# Patient Record
Sex: Female | Born: 1995 | Race: White | Hispanic: No | Marital: Single | Smoking: Never smoker
Health system: Southern US, Community
[De-identification: ages and names within clinical notes are randomized; demographics above are authoritative.]

## PROBLEM LIST (undated history)

## (undated) DIAGNOSIS — L309 Dermatitis, unspecified: Secondary | ICD-10-CM

## (undated) DIAGNOSIS — E039 Hypothyroidism, unspecified: Secondary | ICD-10-CM

## (undated) DIAGNOSIS — Z872 Personal history of diseases of the skin and subcutaneous tissue: Secondary | ICD-10-CM

## (undated) DIAGNOSIS — G43909 Migraine, unspecified, not intractable, without status migrainosus: Secondary | ICD-10-CM

## (undated) DIAGNOSIS — E063 Autoimmune thyroiditis: Secondary | ICD-10-CM

## (undated) DIAGNOSIS — E282 Polycystic ovarian syndrome: Secondary | ICD-10-CM

## (undated) DIAGNOSIS — L438 Other lichen planus: Secondary | ICD-10-CM

## (undated) DIAGNOSIS — K589 Irritable bowel syndrome without diarrhea: Secondary | ICD-10-CM

## (undated) DIAGNOSIS — M359 Systemic involvement of connective tissue, unspecified: Secondary | ICD-10-CM

## (undated) DIAGNOSIS — R7989 Other specified abnormal findings of blood chemistry: Secondary | ICD-10-CM

## (undated) DIAGNOSIS — D8989 Other specified disorders involving the immune mechanism, not elsewhere classified: Secondary | ICD-10-CM

## (undated) DIAGNOSIS — E079 Disorder of thyroid, unspecified: Secondary | ICD-10-CM

## (undated) HISTORY — DX: Dermatitis, unspecified: L30.9

## (undated) HISTORY — DX: Autoimmune thyroiditis: E06.3

## (undated) HISTORY — DX: Systemic involvement of connective tissue, unspecified: M35.9

## (undated) HISTORY — DX: Irritable bowel syndrome, unspecified: K58.9

## (undated) HISTORY — DX: Hypothyroidism, unspecified: E03.9

## (undated) HISTORY — DX: Personal history of diseases of the skin and subcutaneous tissue: Z87.2

## (undated) HISTORY — DX: Other lichen planus: L43.8

## (undated) HISTORY — DX: Migraine, unspecified, not intractable, without status migrainosus: G43.909

## (undated) HISTORY — DX: Polycystic ovarian syndrome: E28.2

## (undated) HISTORY — DX: Other specified abnormal findings of blood chemistry: R79.89

## (undated) HISTORY — DX: Disorder of thyroid, unspecified: E07.9

## (undated) HISTORY — DX: Other specified disorders involving the immune mechanism, not elsewhere classified: D89.89

## (undated) HISTORY — DX: Irritable bowel syndrome without diarrhea: K58.9

---
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
## 2014-02-03 ENCOUNTER — Ambulatory Visit (INDEPENDENT_AMBULATORY_CARE_PROVIDER_SITE_OTHER): Payer: BC Managed Care – PPO | Admitting: Physician Assistant

## 2014-02-03 VITALS — BP 110/70 | HR 84 | Temp 98.2°F | Resp 18 | Ht 64.5 in | Wt 182.0 lb

## 2014-02-03 DIAGNOSIS — Z7185 Encounter for immunization safety counseling: Secondary | ICD-10-CM

## 2014-02-03 DIAGNOSIS — Z7189 Other specified counseling: Secondary | ICD-10-CM

## 2014-02-03 NOTE — Progress Notes (Signed)
   Subjective:    Patient ID: Ariana Scott, female    DOB: 06/01/1996, 18 y.o.   MRN: [id number]  HPI Pt here for an immunization review for college and to have her form filled out.  She does not have to have a CPE for the school form.  Review of Systems     Objective:   Physical Exam  Vitals reviewed. Constitutional: She appears well-developed and well-nourished.  HENT:  Head: Normocephalic and atraumatic.  Right Ear: External ear normal.  Left Ear: External ear normal.  Pulmonary/Chest: Effort normal.  Skin: Skin is warm and dry.  Psychiatric: She has a normal mood and affect. Her behavior is normal. Judgment and thought content normal.       Assessment & Plan:  Immunization counseling Her immunizations were reviewed today.  She needs her 3rd Gardasil.  Her got her Menactra in the 6th grade but she wants to repeat neither of these series today.  She is UTD for vaccines for her college form.   Benny Lennert PA-C  [facility] 02/03/2014 9:26 AM

## 2014-09-12 DIAGNOSIS — R197 Diarrhea, unspecified: Secondary | ICD-10-CM | POA: Insufficient documentation

## 2014-09-12 HISTORY — PX: WISDOM TOOTH EXTRACTION: SHX21

## 2015-09-13 DIAGNOSIS — L438 Other lichen planus: Secondary | ICD-10-CM | POA: Insufficient documentation

## 2018-08-14 DIAGNOSIS — L853 Xerosis cutis: Secondary | ICD-10-CM | POA: Insufficient documentation

## 2018-09-12 HISTORY — PX: UPPER GASTROINTESTINAL ENDOSCOPY: SHX188

## 2018-09-12 HISTORY — PX: COLONOSCOPY: SHX174

## 2019-01-09 ENCOUNTER — Telehealth: Payer: Self-pay

## 2019-01-09 NOTE — Telephone Encounter (Signed)
Faxed request for medical records to [facility] 01/09/19  [facility]

## 2019-01-23 ENCOUNTER — Other Ambulatory Visit: Payer: Self-pay

## 2019-01-24 ENCOUNTER — Encounter: Payer: Self-pay | Admitting: Internal Medicine

## 2019-01-24 ENCOUNTER — Other Ambulatory Visit: Payer: Self-pay

## 2019-01-24 ENCOUNTER — Ambulatory Visit (INDEPENDENT_AMBULATORY_CARE_PROVIDER_SITE_OTHER): Payer: Managed Care, Other (non HMO) | Admitting: Internal Medicine

## 2019-01-24 VITALS — BP 118/60 | HR 90 | Temp 98.3°F | Ht 64.5 in | Wt 196.0 lb

## 2019-01-24 DIAGNOSIS — E039 Hypothyroidism, unspecified: Secondary | ICD-10-CM | POA: Diagnosis not present

## 2019-01-24 LAB — T4, FREE: Free T4: 0.97 ng/dL (ref 0.60–1.60)

## 2019-01-24 LAB — TSH: TSH: 1.38 u[IU]/mL (ref 0.35–4.50)

## 2019-01-24 NOTE — Progress Notes (Signed)
Patient ID: Ariana Scott, female   DOB: 09/18/1995, 23 y.o.   MRN: [id number]    HPI  Ariana Scott is a 23 y.o.-year-old female, referred by Dr. Loreta Ave, for management of newly diagnosed hypothyroidism.  Pt. has been dx with hypothyroidism in 11/2018 during investigation for IBS by Dr. Loreta Ave.  Patient has had constipation for years, but she then started to have loose stools (for 4 years) and was sent to GI.  Dr. Loreta Ave checked her TSH which returned very high, at 42.35.   She was started on levothyroxine 75 mcg daily and referred to endocrinology.  She takes the thyroid hormone: - fasting - with water - separated by 1h from b'fast  - no calcium, iron, PPIs, multivitamins   I reviewed pt's thyroid tests: 12/11/2018: TSH 42.35 No results found for: TSH, FREET4, T3FREE  Antithyroid antibodies: No results found for: THGAB No components found for: TPOAB  Pt describes: - + weight gain - 20 lbs in last 2 years - + fatigue - no cold intolerance - + depression/+ anxietry - + h/o constipation, now loose stools - + dry skin - + hair loss  Pt denies feeling nodules in neck, hoarseness, dysphagia/odynophagia, SOB with lying down.  She has + FH of thyroid disorders in: PGGF. No FH of thyroid cancer.  No h/o radiation tx to head or neck. No recent use of iodine supplements.  Pt. also has a history of trochanteric bursitis.  She is on OCPs for irregular menses - started many years ago (dx'ed with PCOS).  She was also found to have elevated LFTs at recent investigation by Dr. Loreta Ave.  She will have a liver ultrasound for further investigation soon.  ROS: Constitutional: + See HPI, + poor sleep Eyes: no blurry vision, no xerophthalmia ENT: no sore throat, no nodules palpated in throat, no dysphagia/odynophagia, no hoarseness Cardiovascular: + CP/no SOB/palpitations/leg swelling Respiratory: + Cough after eating (mucus + phlegm)/now SOB Gastrointestinal: no N/V/+ D/now C/+ acid  reflux Musculoskeletal: + Both: Muscle/joint aches Skin: + dry skin, + excessive hair growth, + rash in itching (after adapalene gel) Neurological: no tremors/numbness/tingling/dizziness, HA (initially after starting levothyroxine, now resolved) Psychiatric: +both: depression/anxiety + Low libido  Past Medical History:  Diagnosis Date  . Polycystic ovarian syndrome    No past surgical history on file. Social History   Socioeconomic History  . Marital status: Single    Spouse name: Not on file  . Number of children: 0  . Years of education: Not on file  . Highest education level: Not on file  Occupational History  .  Recent college graduate, unemployed  Social Needs  . Financial resource strain: Not on file  . Food insecurity:    Worry: Not on file    Inability: Not on file  . Transportation needs:    Medical: Not on file    Non-medical: Not on file  Tobacco Use  . Smoking status: Never Smoker  . Smokeless tobacco: Never Used  Substance and Sexual Activity  . Alcohol use:  1 glass of wine,  . Drug use: No   Current Outpatient Medications on File Prior to Visit  Medication Sig Dispense Refill  . adapalene (DIFFERIN) 0.1 % gel APPLY TO AFFECTED AREA A THIN LAYER ONCE DAILY BEFORE BEDTIME    . clobetasol ointment (TEMOVATE) 0.05 % APPLY A THIN LAYER TOPICALLY TO THE AFFECTED AREA(S) 2X A DAY FOR 1WK, EVERY OTHER DAY 1WK, 2X A WK.    . hydrOXYzine (ATARAX/VISTARIL)  10 MG tablet Take 10 mg by mouth as needed. 1-2 tablets as needed for anxiety    . levothyroxine (SYNTHROID) 75 MCG tablet Take 75 mcg by mouth daily.    . mometasone (ELOCON) 0.1 % ointment Apply topically daily. Thin layer applied to fingers every night    . VELIVET 0.1/0.125/0.15 -0.025 MG tablet Take 1 tablet by mouth daily.     No current facility-administered medications on file prior to visit.    Allergies  Allergen Reactions  . Kiwi Extract Itching  . Pineapple Itching   Family history: -Mother,  aunt with diabetes -Father with HTN and HL -Cancer in paternal grandmother: Breast and uterus + See HPI  PE: BP 118/60   Pulse 90   Temp 98.3 F (36.8 C)   Ht 5' 4.5" (1.638 m)   Wt 196 lb (88.9 kg)   SpO2 98%   BMI 33.12 kg/m  Wt Readings from Last 3 Encounters:  01/24/19 196 lb (88.9 kg)  02/03/14 182 lb (82.6 kg) (95 %, Z= 1.69)*  09/11/13 180 lb (81.6 kg) (95 %, Z= 1.67)*   * Growth percentiles are based on CDC (Girls, 2-20 Years) data.   Constitutional: overweight, in NAD Eyes: PERRLA, EOMI, no exophthalmos ENT: moist mucous membranes, no thyromegaly, no cervical lymphadenopathy Cardiovascular: Tachycardia, RR, No MRG Respiratory: CTA B Gastrointestinal: abdomen soft, NT, ND, BS+ Musculoskeletal: no deformities, strength intact in all 4 Skin: moist, warm, no rashes Neurological: + Mild tremor with outstretched hands, DTR normal in all 4  ASSESSMENT: 1. Hypothyroidism  PLAN:  1. Patient with recent diagnosis of hypothyroidism, started on levothyroxine therapy (75 mcg daily) 1.5 months ago. - she appears euthyroid but has several symptoms consistent with hypothyroidism: Weight gain, dry skin, change in bowel habits.  She has anxiety with subsequent tachycardia and tremors.  She is on antianxiety medication as needed.  She feels better after she started levothyroxine.  She initially had headaches with the medication now resolved.  She still has fatigue but improved on levothyroxine - she does not appear to have a goiter, thyroid nodules, or neck compression symptoms - We discussed about correct intake of levothyroxine, fasting, with water, separated by at least 30 minutes from breakfast, and separated by more than 4 hours from calcium, iron, multivitamins, acid reflux medications (PPIs). - will check thyroid tests today: TSH, free T4 and will also check her antithyroid antibodies to check for Hashimoto's thyroiditis.   - We discussed that Hashimoto's thyroiditis is an  autoimmune disease which attacks her thyroid.  We also discussed about ways to improve autoimmunity in case she does have Hashimoto's thyroiditis.  Improving diet, exercise, sleep, relaxation techniques to relieve stress should all help.  Sometimes selenium supplementation can be helpful in decreasing the antibodies and therefore the autoimmune thyroid attack. - For now, continue levothyroxine and we discussed that if we end up changing the dose we will need to repeat her TFTs in 1.5 months. -  I will see her back in 4 months  Component     Latest Ref Rng & Units 01/24/2019  TSH     0.35 - 4.50 uIU/mL 1.38  T4,Free(Direct)     0.60 - 1.60 ng/dL 1.610.97  Thyroperoxidase Ab SerPl-aCnc     <9 IU/mL 395 (H)  Thyroglobulin Ab     < or = 1 IU/mL 2 (H)  TFTs are normal on levothyroxine.  We will continue the current dose. She has a new diagnosis of Hashimoto's thyroiditis.  Silvestre Mesiristina  Cruzita Lederer, MD PhD [facility]

## 2019-01-24 NOTE — Patient Instructions (Signed)
Please continue 75 mcg levothyroxine daily.  Take the thyroid hormone every day, with water, at least 30 minutes before breakfast, separated by at least 4 hours from: - acid reflux medications - calcium - iron - multivitamins  Please stop at the lab.  Please come back for a follow-up appointment in 4 months.

## 2019-01-25 LAB — THYROGLOBULIN ANTIBODY: Thyroglobulin Ab: 2 IU/mL — ABNORMAL HIGH (ref <=1)

## 2019-01-25 LAB — THYROID PEROXIDASE ANTIBODY: Thyroperoxidase Ab SerPl-aCnc: 395 IU/mL — ABNORMAL HIGH (ref <9)

## 2019-01-31 ENCOUNTER — Encounter: Payer: Self-pay | Admitting: Internal Medicine

## 2019-02-01 ENCOUNTER — Other Ambulatory Visit: Payer: Self-pay | Admitting: Internal Medicine

## 2019-02-01 DIAGNOSIS — E038 Other specified hypothyroidism: Secondary | ICD-10-CM

## 2019-02-01 DIAGNOSIS — E039 Hypothyroidism, unspecified: Secondary | ICD-10-CM

## 2019-02-13 ENCOUNTER — Other Ambulatory Visit: Payer: Self-pay | Admitting: Gastroenterology

## 2019-02-13 DIAGNOSIS — R198 Other specified symptoms and signs involving the digestive system and abdomen: Secondary | ICD-10-CM

## 2019-02-13 DIAGNOSIS — K529 Noninfective gastroenteritis and colitis, unspecified: Secondary | ICD-10-CM

## 2019-03-04 ENCOUNTER — Ambulatory Visit
Admission: RE | Admit: 2019-03-04 | Discharge: 2019-03-04 | Disposition: A | Discharge status: Home / Self Care | Payer: Managed Care, Other (non HMO) | Source: Ambulatory Visit | Attending: Gastroenterology | Admitting: Gastroenterology

## 2019-03-04 ENCOUNTER — Other Ambulatory Visit: Payer: Self-pay

## 2019-03-04 DIAGNOSIS — K529 Noninfective gastroenteritis and colitis, unspecified: Secondary | ICD-10-CM

## 2019-03-04 DIAGNOSIS — R198 Other specified symptoms and signs involving the digestive system and abdomen: Secondary | ICD-10-CM

## 2019-03-04 MED ORDER — IOPAMIDOL (ISOVUE-300) INJECTION 61%
100.0000 mL | Freq: Once | INTRAVENOUS | Status: AC | PRN
  Administered 2019-03-04: 100 mL via INTRAVENOUS

## 2019-03-05 ENCOUNTER — Other Ambulatory Visit: Payer: Self-pay | Admitting: Internal Medicine

## 2019-03-07 ENCOUNTER — Other Ambulatory Visit: Payer: Self-pay | Admitting: Gastroenterology

## 2019-03-07 DIAGNOSIS — R7989 Other specified abnormal findings of blood chemistry: Secondary | ICD-10-CM

## 2019-03-28 ENCOUNTER — Other Ambulatory Visit: Payer: Self-pay

## 2019-04-01 ENCOUNTER — Encounter: Payer: Self-pay | Admitting: Internal Medicine

## 2019-04-01 ENCOUNTER — Other Ambulatory Visit: Payer: Self-pay

## 2019-04-01 ENCOUNTER — Ambulatory Visit: Payer: Managed Care, Other (non HMO) | Admitting: Internal Medicine

## 2019-04-01 ENCOUNTER — Other Ambulatory Visit: Payer: Self-pay | Admitting: Internal Medicine

## 2019-04-01 VITALS — BP 114/82 | HR 85 | Ht 64.5 in | Wt 199.6 lb

## 2019-04-01 DIAGNOSIS — E038 Other specified hypothyroidism: Secondary | ICD-10-CM

## 2019-04-01 DIAGNOSIS — R002 Palpitations: Secondary | ICD-10-CM | POA: Diagnosis not present

## 2019-04-01 DIAGNOSIS — E063 Autoimmune thyroiditis: Secondary | ICD-10-CM | POA: Diagnosis not present

## 2019-04-01 LAB — T4, FREE: Free T4: 0.46 ng/dL — ABNORMAL LOW (ref 0.60–1.60)

## 2019-04-01 LAB — TSH: TSH: 85.26 u[IU]/mL — ABNORMAL HIGH (ref 0.35–4.50)

## 2019-04-01 MED ORDER — LEVOTHYROXINE SODIUM 50 MCG PO TABS: 75.0000 ug | ORAL_TABLET | Freq: Every day | ORAL | 3 refills | Status: DC

## 2019-04-01 NOTE — Progress Notes (Signed)
Patient ID: Ariana ChessmanKari K Croom, female   DOB: 30-May-1996, 23 y.o.   MRN: [id number]    HPI  Ariana ChessmanKari K Dier is a 23 y.o.-year-old female, initially referred by Dr. Loreta AveMann, returning for follow-up for Hashimoto's hypothyroidism.  Last visit 2 months ago.  After our last visit, she called with palpitations I advised her to cut the dose of levothyroxine in half for a week and then try to alternate a full tablet with half a tablet before increasing it to the full tablet if tolerated.  She did well on the lower doses, but developed palpitations again after approximately 3 weeks of alternating doses.  She continues on this regimen.  She feels confident that the palpitations are from levothyroxine since they only started after she started the medication.  Since last visit, she was diagnosed with an autoimmune liver disorder >> will see rheumatology. Will have a liver U/S tomorrow.  Reviewed patient's history: Pt. has been dx with hypothyroidism  in 11/2018  during investigation for IBS by Dr. Loreta AveMann.  Patient has had constipation for years, but she then started to have loose stools (for 4 years) and was sent to GI.  Dr. Loreta AveMann checked her TSH which returned very high, at 42.35.   She was started on levothyroxine 75 mcg daily and referred to endocrinology.  At this visit, she continues levothyroxine 75 Mcg daily >> ~52-59 mcg/day (75 alternating with 37.5 mcg every other day), taken: - in am - fasting - at least 30 min from b'fast - no Ca, Fe, MVI, PPIs - not on Biotin  I reviewed pt's thyroid tests: Lab Results  Component Value Date   TSH 1.38 01/24/2019   FREET4 0.97 01/24/2019  12/11/2018: TSH 42.35  Antithyroid antibodies were positive, giving her a diagnosis of Hashimoto's thyroiditis: Component     Latest Ref Rng & Units 01/24/2019  Thyroperoxidase Ab SerPl-aCnc     <9 IU/mL 395 (H)  Thyroglobulin Ab     < or = 1 IU/mL 2 (H)   At last visit, patient described: -Weight gain of 20 pounds in 3  years, fatigue, dry skin, hair loss, depression and anxiety.    Symptoms have improved with the exception of palpitations.  Pt denies: - feeling nodules in neck - hoarseness - dysphagia - choking - SOB with lying down  She has + FH of thyroid disorders in: PGGF. No FH of thyroid cancer. No h/o radiation tx to head or neck.  No seaweed or kelp. No recent contrast studies. No herbal supplements. No Biotin use. No recent steroids use.   She also has a history of trochanteric bursitis.  She is on OCPs for irregular menses - started many years ago (diagnosed with PCOS).  She was also found to have elevated LFTs at recent investigation by Dr. Loreta AveMann.  She will have a liver ultrasound for further investigation soon.  ROS: Constitutional: + weight gain/no weight loss, no fatigue, no subjective hyperthermia, no subjective hypothermia Eyes: no blurry vision, no xerophthalmia ENT: no sore throat, + see HPI Cardiovascular: no CP/no SOB/+ palpitations/no leg swelling Respiratory: no cough/no SOB/no wheezing Gastrointestinal: no N/no V/no D/no C/no acid reflux Musculoskeletal: no muscle aches/no joint aches Skin: no rashes, + hair loss, + dry skin Neurological: no tremors/no numbness/no tingling/no dizziness  I reviewed pt's medications, allergies, PMH, social hx, family hx, and changes were documented in the history of present illness. Otherwise, unchanged from my initial visit note.sfr Past Medical History:  Diagnosis Date  . Polycystic  ovarian syndrome    No past surgical history on file. Social History   Socioeconomic History  . Marital status: Single    Spouse name: Not on file  . Number of children: 0  . Years of education: Not on file  . Highest education level: Not on file  Occupational History  .  Recent college graduate, unemployed  Social Needs  . Financial resource strain: Not on file  . Food insecurity:    Worry: Not on file    Inability: Not on file  .  Transportation needs:    Medical: Not on file    Non-medical: Not on file  Tobacco Use  . Smoking status: Never Smoker  . Smokeless tobacco: Never Used  Substance and Sexual Activity  . Alcohol use:  1 glass of wine,  . Drug use: No   Current Outpatient Medications on File Prior to Visit  Medication Sig Dispense Refill  . adapalene (DIFFERIN) 0.1 % gel APPLY TO AFFECTED AREA A THIN LAYER ONCE DAILY BEFORE BEDTIME    . clobetasol ointment (TEMOVATE) 0.05 % APPLY A THIN LAYER TOPICALLY TO THE AFFECTED AREA(S) 2X A DAY FOR 1WK, EVERY OTHER DAY 1WK, 2X A WK.    . hydrOXYzine (ATARAX/VISTARIL) 10 MG tablet Take 10 mg by mouth as needed. 1-2 tablets as needed for anxiety    . levothyroxine (SYNTHROID) 75 MCG tablet TAKE 1 TABLET BY MOUTH EVERY DAY 90 tablet 1  . mometasone (ELOCON) 0.1 % ointment Apply topically daily. Thin layer applied to fingers every night    . VELIVET 0.1/0.125/0.15 -0.025 MG tablet Take 1 tablet by mouth daily.     No current facility-administered medications on file prior to visit.    Allergies  Allergen Reactions  . Kiwi Extract Itching  . Pineapple Itching   Family history: -Mother, aunt with diabetes -Father with HTN and HL -Cancer in paternal grandmother: Breast and uterus + See HPI  PE: BP 114/82 (BP Location: Left Arm, Patient Position: Sitting, Cuff Size: Normal)   Pulse 85   Ht 5' 4.5" (1.638 m)   Wt 199 lb 9.6 oz (90.5 kg)   SpO2 98%   BMI 33.73 kg/m  Wt Readings from Last 3 Encounters:  04/01/19 199 lb 9.6 oz (90.5 kg)  01/24/19 196 lb (88.9 kg)  02/03/14 182 lb (82.6 kg) (95 %, Z= 1.69)*   * Growth percentiles are based on CDC (Girls, 2-20 Years) data.   Constitutional: overweight, in NAD Eyes: PERRLA, EOMI, no exophthalmos ENT: moist mucous membranes, no thyromegaly, no cervical lymphadenopathy Cardiovascular: RRR, No MRG Respiratory: CTA B Gastrointestinal: abdomen soft, NT, ND, BS+ Musculoskeletal: no deformities, strength intact in  all 4 Skin: moist, warm, no rashes Neurological: + Mild tremor with outstretched hands, DTR normal in all 4  ASSESSMENT: 1. Hypothyroidism -Due to Hashimoto's thyroiditis  2.  Palpitations  PLAN:  1. Patient with a relatively recent diagnosis of hypothyroidism, initially with several symptoms consistent with hypothyroidism: Weight gain, dry skin, change in bowel habits, but also anxiety with tachycardia and tremors.  She is on a as needed antianxiety medication.  She was started on levothyroxine and she felt better afterwards, with less fatigue.  She initially had headaches with the medication but this then resolved. - latest thyroid labs reviewed with pt >> normal, so we needed to decrease the dose afterwards - she continues on LT4 ~52-59 mcg daily - pt feels good on this dose, but she had palpitations with the dose so we  had to decrease the LT4  dose (please see HPI) - we discussed about taking the thyroid hormone every day, with water, >30 minutes before breakfast, separated by >4 hours from acid reflux medications, calcium, iron, multivitamins. Pt. is taking it correctly. - will check thyroid tests today: TSH and fT4 - If labs are abnormal, she will need to return for repeat TFTs in 1.5 months -  I will see her back in 6 months  2.  Palpitations -New problem since last visit  -Heart rate not elevated at this visit -She may have an entire week between episodes but sometimes appear every day, regardless of exertion -She feels that they are related to levothyroxine -She tolerated the 37.5 mcg levothyroxine daily very well, without palpitations, but they again appeared after she increased the dose to ~52-59 mcg daily -At this visit we discussed about options of changing to white levothyroxine tablets as she may have an allergy to 1 of the dyes in the colored tablets, and we can also switch to Synthroid if this is not helping.  Alternatively, we can try liquid levothyroxine (thyroxine), but  I explained that this is expensive and not the first option due to price.  Office Visit on 04/01/2019  Component Date Value Ref Range Status  . Free T4 04/01/2019 0.46* 0.60 - 1.60 ng/dL Final   Comment: Specimens from patients who are undergoing biotin therapy and /or ingesting biotin supplements may contain high levels of biotin.  The higher biotin concentration in these specimens interferes with this Free T4 assay.  Specimens that contain high levels  of biotin may cause false high results for this Free T4 assay.  Please interpret results in light of the total clinical presentation of the patient.    Marland Kitchen TSH 04/01/2019 >50.50* 0.35 - 4.50 uIU/mL Final   Msg sent: Dear Sharrie Rothman, The tests have changed dramatically: Your TSH is now undetectably high.  It looks like your body is not absorbing any of the levothyroxine that you are taking. At this point, I will send to your pharmacy tablets of 50 mcg levothyroxine and please take 1.5 tablets daily.  We will need to repeat the test in 5 to 6 weeks. Sincerely, Philemon Kingdom MD   Philemon Kingdom, MD PhD [facility]

## 2019-04-01 NOTE — Patient Instructions (Addendum)
Please stop at the lab.  For now continue: - Levothyroxine 75 mcg alternating with 37.5 mcg every other day   Take the thyroid hormone every day, with water, at least 30 minutes before breakfast, separated by at least 4 hours from: - acid reflux medications - calcium - iron - multivitamins  Please come back for a follow-up appointment in 6 months.

## 2019-04-02 ENCOUNTER — Ambulatory Visit
Admission: RE | Admit: 2019-04-02 | Discharge: 2019-04-02 | Disposition: A | Discharge status: Home / Self Care | Payer: Managed Care, Other (non HMO) | Source: Ambulatory Visit | Attending: Gastroenterology | Admitting: Gastroenterology

## 2019-04-02 DIAGNOSIS — R7989 Other specified abnormal findings of blood chemistry: Secondary | ICD-10-CM

## 2019-04-29 NOTE — Progress Notes (Signed)
Office Visit Note  Patient: Ariana Scott             Date of Birth: Apr 28, 1996           MRN: [id number]             PCP: Mancel Bale, PA-C Referring: Juanita Craver, MD Visit Date: 05/01/2019 Occupation: Gardening for [employer]  Subjective:  Positive ANA and joint pain.   History of Present Illness: Ariana Scott is a 23 y.o. female seen in consultation per request of Dr. Collene Mares.  According to patient she has had history of joint pain since she was in middle school.  She is describes the pain mostly in the lower back in the left trap and between her shoulder blades.  The pain persist.  She states recently she was having diarrhea after eating certain food and she went to see Dr. Collene Mares.  Dr. Collene Mares did some lab work and her ANA came positive.  She was also found to have elevated LFTs.  According to her the CT scan of abdomen was performed on the ultrasound of the abdomen was performed.  I just reviewed the report and ultrasound of the abdomen showed fatty liver.  She states she is also had acne since she was in middle school.  She has never seen a dermatologist.  She was treated by her GYN initially with Differin and later with Retin-A.  She has been on Retin-A and minocycline treatment until 2 months ago.  She states she discontinued minocycline 2 months ago because of nausea and dizziness.  She does get photosensitivity.  She states her face is always red.  She also gives history of some dry scales on her hands and some tightness in her hands.  She has no other joint involvement.  Activities of Daily Living:  Patient reports morning stiffness for 24 hours.   Patient Reports nocturnal pain.  Difficulty dressing/grooming: Denies Difficulty climbing stairs: Denies Difficulty getting out of chair: Denies Difficulty using hands for taps, buttons, cutlery, and/or writing: Denies  Review of Systems  Constitutional: Positive for fatigue. Negative for night sweats, weight gain and weight loss.   HENT: Negative for mouth sores, trouble swallowing, trouble swallowing, mouth dryness and nose dryness.   Eyes: Positive for dryness. Negative for pain, redness, itching and visual disturbance.  Respiratory: Negative for cough, shortness of breath, wheezing and difficulty breathing.   Cardiovascular: Negative for chest pain, palpitations, hypertension, irregular heartbeat and swelling in legs/feet.  Gastrointestinal: Positive for constipation and diarrhea. Negative for abdominal pain and blood in stool.       Patient has been evaluated GI   Endocrine: Negative for increased urination.  Genitourinary: Negative for difficulty urinating, painful urination and vaginal dryness.  Musculoskeletal: Positive for arthralgias, joint pain and morning stiffness. Negative for joint swelling, myalgias, muscle weakness, muscle tenderness and myalgias.  Skin: Positive for rash, redness and skin tightness. Negative for color change, hair loss, ulcers and sensitivity to sunlight.  Allergic/Immunologic: Negative for susceptible to infections.  Neurological: Negative for dizziness, light-headedness, headaches, memory loss, night sweats and weakness.  Hematological: Negative for swollen glands.  Psychiatric/Behavioral: Positive for sleep disturbance. Negative for depressed mood and confusion. The patient is nervous/anxious.     PMFS History:  Patient Active Problem List   Diagnosis Date Noted  . Anxiety 05/01/2019  . Lichen planus 49/20/1007  . Other acne 05/01/2019  . Hypothyroidism (acquired) 01/24/2019    Past Medical History:  Diagnosis Date  .  Polycystic ovarian syndrome     Family History  Problem Relation Age of Onset  . Diabetes Mother   . Depression Mother   . ADD / ADHD Mother   . Depression Father   . Vitamin D deficiency Father   . Colitis Father   . Hypertension Father   . High Cholesterol Father   . ADD / ADHD Brother    Past Surgical History:  Procedure Laterality Date  .  COLONOSCOPY  2020   Dr. Collene Mares  . UPPER GASTROINTESTINAL ENDOSCOPY  2020   Dr. Collene Mares  . WISDOM TOOTH EXTRACTION  2016   Social History   Social History Narrative  . Not on file   Immunization History  Administered Date(s) Administered  . DTaP 10/16/1995, 11/29/1995, 01/11/1996, 01/10/1997, 04/28/2000  . HPV Quadrivalent 05/21/2007, 07/03/2008  . Hepatitis A 05/21/2007, 07/03/2008  . Hepatitis B [birth year]1995, 08/01/1995, 01/11/1996  . HiB (PRP-OMP) 10/16/1995, 11/29/1995, 01/11/1996, 01/10/1997  . IPV 10/16/1995, 11/29/1995, 01/11/1996, 04/28/2000  . MMR 07/02/1996, 04/28/2000  . Meningococcal Conjugate 05/21/2007  . Tdap 04/23/2007     Objective: Vital Signs: BP 121/86 (BP Location: Right Arm, Patient Position: Sitting, Cuff Size: Normal)   Pulse 75   Resp 13   Ht 5' 4.5" (1.638 m)   Wt 196 lb (88.9 kg)   BMI 33.12 kg/m    Physical Exam Vitals signs and nursing note reviewed.  Constitutional:      Appearance: She is well-developed.  HENT:     Head: Normocephalic and atraumatic.  Eyes:     Conjunctiva/sclera: Conjunctivae normal.  Neck:     Musculoskeletal: Normal range of motion.  Cardiovascular:     Rate and Rhythm: Normal rate and regular rhythm.     Heart sounds: Normal heart sounds.  Pulmonary:     Effort: Pulmonary effort is normal.     Breath sounds: Normal breath sounds.  Abdominal:     General: Bowel sounds are normal.     Palpations: Abdomen is soft.  Lymphadenopathy:     Cervical: No cervical adenopathy.  Skin:    General: Skin is warm and dry.     Capillary Refill: Capillary refill takes less than 2 seconds.     Findings: Erythema present.     Comments: Facial erythema was noted.  Some acne was noted on the face.  Dry scales were noted on her bilateral index finger most likely eczema.  Neurological:     Mental Status: She is alert and oriented to person, place, and time.  Psychiatric:        Behavior: Behavior normal.      Musculoskeletal Exam:  C-spine thoracic and lumbar spine with good range of motion.  Although she had discomfort with range of motion of her cervical and lumbar spine.  She had bilateral trapezius spasm.  She had tenderness on palpation over bilateral SI joints and gluteal region.  She had tenderness over trochanteric area.  Shoulder joints, elbow joints, wrist joints, MCPs PIPs and DIPs with good range of motion with no synovitis.  Hip joints, knee joints, ankles, MTPs and PIPs with good range of motion with no synovitis.  CDAI Exam: CDAI Score: - Patient Global: -; Provider Global: - Swollen: -; Tender: - Joint Exam   No joint exam has been documented for this visit   There is currently no information documented on the homunculus. Go to the Rheumatology activity and complete the homunculus joint exam.  Investigation: No additional findings.  Imaging: Xr Cervical Spine 2  Or 3 Views  Result Date: 05/01/2019 No significant disc space narrowing was noted.  No facet joint arthropathy was noted.  Cervical muscle spasm was noted. Impression: Unremarkable x-ray of the cervical spine.  Xr Lumbar Spine 2-3 Views  Result Date: 05/01/2019 No disc space narrowing was noted.  No facet joint arthropathy was noted.  No SI joint sclerosis was noted.  Mild scoliosis was noted. Impression: Unremarkable x-ray of the lumbar spine.  US Abdomen Limited Ruq  Result Date: 04/02/2019 CLINICAL DATA:  Elevated LFTs. EXAM: ULTRASOUND ABDOMEN LIMITED RIGHT UPPER QUADRANT COMPARISON:  CT of the abdomen and pelvis 03/04/2019. FINDINGS: Gallbladder: No gallstones or wall thickening visualized. No sonographic Murphy sign noted by sonographer. Maximal wall thickness is 1.2 mm, within normal limits Common bile duct: Diameter: 3.4 mm, within normal limits Liver: Liver parenchyma is mildly echogenic. No discrete lesions are present. Portal vein is patent on color Doppler imaging with normal direction of blood flow towards the liver. IMPRESSION: 1.  The liver is diffusely echogenic, suggesting hepatic steatosis. No discrete lesions are present. 2. Normal sonographic appearance of the common bile duct and gallbladder. Electronically Signed   By: San Morelle M.D.   On: 04/02/2019 13:54    Recent Labs: No results found for: WBC, HGB, PLT, NA, K, CL, CO2, GLUCOSE, BUN, CREATININE, BILITOT, ALKPHOS, AST, ALT, PROT, ALBUMIN, CALCIUM, GFRAA, QFTBGOLD, QFTBGOLDPLUS  Speciality Comments: No specialty comments available.  Procedures:  No procedures performed Allergies: Kiwi extract and Pineapple   Assessment / Plan:     Visit Diagnoses: Positive ANA (antinuclear antibody) - 03/05/19:ANA+, dsDNA 26, smooth muscle ab 20, alpha-1-antitrypsin WNL, Hep A-, Hep B-, Hep C-, RNP-, Ro+, La-, smith-, tTG 2, endomysial Ab- -patient has positive ANA with no titer or pattern given.  She also has positive Ro and positive double-stranded DNA.  She has no clinical features of autoimmune disease except for elevated LFTs and mild photosensitivity.  Patient reports that she was taking minocycline for acne and she discontinued about 2 months ago due to nausea.  It is not unusual to see positive ANA induced from minocycline.  Also reports show positive double-stranded DNA and positive Ro antibody induced by minocycline.  These antibody titers usually go away after patient has been off minocycline for about 12 months.  Usually patients do not develop any symptoms.  I would like to repeat some of the antibodies today.  Plan: Urinalysis, Routine w reflex microscopic, Sedimentation rate, ANA, Anti-DNA antibody, double-stranded, Sjogrens syndrome-A extractable nuclear antibody, Anti-scleroderma antibody, C3 and C4, Beta-2 glycoprotein antibodies, Cardiolipin antibodies, IgG, IgM, IgA, Lupus Anticoagulant Eval w/Reflex  Neck pain -she complains of cervical pain for many years since she was in the middle school.  She had trapezius spasm today.  She request x-ray of her  cervical spine.  Plan: XR Cervical Spine 2 or 3 views x-ray of the cervical spine was unremarkable.  She has some muscle spasm.  Chronic midline low back pain without sciatica -she complains of chronic lower back pain.  She is concerned that something else is going on in her lumbar spine.  I will obtain x-rays today.  Plan: XR Lumbar Spine 2-3 Views, x-ray of the lumbar skin was unremarkable.  Mild scoliosis was noted.  SI joints were unremarkable.  Elevated LFTs -her LFTs in March 2020 showed ALT 192, AST 182 and repeat labs in June 2020 showed ALT to 222 and AST 220.  I also reviewed the ultrasound of her abdomen which showed fatty liver.  I am uncertain if that really explains elevated LFTs completely.  Fatty liver -noticed on the ultrasound.  History of hypothyroidism -she is on treatment.  Other acne -patient has been treated by her GYN for several years.  She has been on Differin in the past.  She recently came off minocycline.  Now she is using Retin-A.  I do notice some erythema on her face most likely due to the use of Retin-A.  Lichen planus -labial.  She used topical steroids.  Anxiety -she is currently not using any medications.  Other fatigue - Plan: CBC with Differential/Platelet, COMPLETE METABOLIC PANEL WITH GFR, CK,  Orders: Orders Placed This Encounter  Procedures  . XR Lumbar Spine 2-3 Views  . XR Cervical Spine 2 or 3 views  . CBC with Differential/Platelet  . COMPLETE METABOLIC PANEL WITH GFR  . Urinalysis, Routine w reflex microscopic  . CK  . Sedimentation rate  . ANA  . Anti-DNA antibody, double-stranded  . Sjogrens syndrome-A extractable nuclear antibody  . Anti-scleroderma antibody  . C3 and C4  . Beta-2 glycoprotein antibodies  . Cardiolipin antibodies, IgG, IgM, IgA  . Lupus Anticoagulant Eval w/Reflex   No orders of the defined types were placed in this encounter.   Face-to-face time spent with patient was 50 minutes. Greater than 50% of time was  spent in counseling and coordination of care.  Follow-Up Instructions: Return for +ANA.   Bo Merino, MD  Note - This record has been created using Editor, commissioning.  Chart creation errors have been sought, but may not always  have been located. Such creation errors do not reflect on  the standard of medical care.

## 2019-05-01 ENCOUNTER — Ambulatory Visit (INDEPENDENT_AMBULATORY_CARE_PROVIDER_SITE_OTHER): Payer: Managed Care, Other (non HMO)

## 2019-05-01 ENCOUNTER — Other Ambulatory Visit: Payer: Self-pay

## 2019-05-01 ENCOUNTER — Ambulatory Visit: Payer: Managed Care, Other (non HMO) | Admitting: Rheumatology

## 2019-05-01 ENCOUNTER — Encounter: Payer: Self-pay | Admitting: Rheumatology

## 2019-05-01 VITALS — BP 121/86 | HR 75 | Resp 13 | Ht 64.5 in | Wt 196.0 lb

## 2019-05-01 DIAGNOSIS — Z8639 Personal history of other endocrine, nutritional and metabolic disease: Secondary | ICD-10-CM

## 2019-05-01 DIAGNOSIS — L708 Other acne: Secondary | ICD-10-CM | POA: Insufficient documentation

## 2019-05-01 DIAGNOSIS — G8929 Other chronic pain: Secondary | ICD-10-CM

## 2019-05-01 DIAGNOSIS — M542 Cervicalgia: Secondary | ICD-10-CM

## 2019-05-01 DIAGNOSIS — L439 Lichen planus, unspecified: Secondary | ICD-10-CM | POA: Insufficient documentation

## 2019-05-01 DIAGNOSIS — R768 Other specified abnormal immunological findings in serum: Secondary | ICD-10-CM | POA: Diagnosis not present

## 2019-05-01 DIAGNOSIS — F419 Anxiety disorder, unspecified: Secondary | ICD-10-CM

## 2019-05-01 DIAGNOSIS — R7989 Other specified abnormal findings of blood chemistry: Secondary | ICD-10-CM

## 2019-05-01 DIAGNOSIS — R945 Abnormal results of liver function studies: Secondary | ICD-10-CM | POA: Diagnosis not present

## 2019-05-01 DIAGNOSIS — M545 Low back pain, unspecified: Secondary | ICD-10-CM

## 2019-05-01 DIAGNOSIS — K760 Fatty (change of) liver, not elsewhere classified: Secondary | ICD-10-CM

## 2019-05-01 DIAGNOSIS — R5383 Other fatigue: Secondary | ICD-10-CM

## 2019-05-05 LAB — URINALYSIS, ROUTINE W REFLEX MICROSCOPIC
Bilirubin Urine: NEGATIVE
Glucose, UA: NEGATIVE
Hgb urine dipstick: NEGATIVE
Ketones, ur: NEGATIVE
Leukocytes,Ua: NEGATIVE
Nitrite: NEGATIVE
Protein, ur: NEGATIVE
Specific Gravity, Urine: 1.017 (ref 1.001–1.03)
pH: 5.5 (ref 5.0–8.0)

## 2019-05-05 LAB — CBC WITH DIFFERENTIAL/PLATELET
Absolute Monocytes: 370 cells/uL (ref 200–950)
Basophils Absolute: 10 cells/uL (ref 0–200)
Basophils Relative: 0.2 %
Eosinophils Absolute: 19 cells/uL (ref 15–500)
Eosinophils Relative: 0.4 %
HCT: 42.3 % (ref 35.0–45.0)
Hemoglobin: 14.3 g/dL (ref 11.7–15.5)
Lymphs Abs: 1790 cells/uL (ref 850–3900)
MCH: 30.2 pg (ref 27.0–33.0)
MCHC: 33.8 g/dL (ref 32.0–36.0)
MCV: 89.2 fL (ref 80.0–100.0)
MPV: 9.7 fL (ref 7.5–12.5)
Monocytes Relative: 7.7 %
Neutro Abs: 2611 cells/uL (ref 1500–7800)
Neutrophils Relative %: 54.4 %
Platelets: 253 10*3/uL (ref 140–400)
RBC: 4.74 10*6/uL (ref 3.80–5.10)
RDW: 12.9 % (ref 11.0–15.0)
Total Lymphocyte: 37.3 %
WBC: 4.8 10*3/uL (ref 3.8–10.8)

## 2019-05-05 LAB — LUPUS ANTICOAGULANT EVAL W/ REFLEX
PTT-LA Screen: 38 s (ref <=40)
dRVVT: 42 s (ref <=45)

## 2019-05-05 LAB — ANA: Anti Nuclear Antibody (ANA): POSITIVE — AB

## 2019-05-05 LAB — COMPLETE METABOLIC PANEL WITH GFR
AG Ratio: 1.3 (calc) (ref 1.0–2.5)
ALT: 126 U/L — ABNORMAL HIGH (ref 6–29)
AST: 116 U/L — ABNORMAL HIGH (ref 10–30)
Albumin: 4.3 g/dL (ref 3.6–5.1)
Alkaline phosphatase (APISO): 47 U/L (ref 31–125)
BUN: 11 mg/dL (ref 7–25)
CO2: 22 mmol/L (ref 20–32)
Calcium: 9.7 mg/dL (ref 8.6–10.2)
Chloride: 104 mmol/L (ref 98–110)
Creat: 0.72 mg/dL (ref 0.50–1.10)
GFR, Est African American: 137 mL/min/{1.73_m2} (ref >=60)
GFR, Est Non African American: 118 mL/min/{1.73_m2} (ref >=60)
Globulin: 3.2 g/dL (calc) (ref 1.9–3.7)
Glucose, Bld: 96 mg/dL (ref 65–99)
Potassium: 3.9 mmol/L (ref 3.5–5.3)
Sodium: 136 mmol/L (ref 135–146)
Total Bilirubin: 0.4 mg/dL (ref 0.2–1.2)
Total Protein: 7.5 g/dL (ref 6.1–8.1)

## 2019-05-05 LAB — SEDIMENTATION RATE: Sed Rate: 31 mm/h — ABNORMAL HIGH (ref 0–20)

## 2019-05-05 LAB — CARDIOLIPIN ANTIBODIES, IGG, IGM, IGA
Anticardiolipin IgA: 13 [APL'U] — ABNORMAL HIGH
Anticardiolipin IgG: <14 [GPL'U]
Anticardiolipin IgM: <12 [MPL'U]

## 2019-05-05 LAB — CK: Total CK: 144 U/L — ABNORMAL HIGH (ref 29–143)

## 2019-05-05 LAB — C3 AND C4
C3 Complement: 189 mg/dL (ref 83–193)
C4 Complement: 30 mg/dL (ref 15–57)

## 2019-05-05 LAB — BETA-2 GLYCOPROTEIN ANTIBODIES
Beta-2 Glyco 1 IgA: <9 SAU (ref <=20)
Beta-2 Glyco 1 IgM: <9 SMU (ref <=20)
Beta-2 Glyco I IgG: <9 SGU (ref <=20)

## 2019-05-05 LAB — ANTI-NUCLEAR AB-TITER (ANA TITER)
ANA TITER: 1:80 {titer} — ABNORMAL HIGH
ANA Titer 1: 1:40 {titer} — ABNORMAL HIGH

## 2019-05-05 LAB — SJOGRENS SYNDROME-A EXTRACTABLE NUCLEAR ANTIBODY: SSA (Ro) (ENA) Antibody, IgG: >8 AI — AB

## 2019-05-05 LAB — ANTI-SCLERODERMA ANTIBODY: Scleroderma (Scl-70) (ENA) Antibody, IgG: <1 AI

## 2019-05-05 LAB — ANTI-DNA ANTIBODY, DOUBLE-STRANDED: ds DNA Ab: 29 IU/mL — ABNORMAL HIGH

## 2019-05-06 NOTE — Progress Notes (Signed)
She has several autoimmune antibodies positive.  She has appointment on September 9.  My plan is to discuss lab results then.  If she prefers to come early for her appointment and you have any openings then please schedule an appointment for her.

## 2019-05-09 ENCOUNTER — Encounter: Payer: Self-pay | Admitting: Internal Medicine

## 2019-05-10 NOTE — Progress Notes (Signed)
Office Visit Note  Patient: Ariana Scott             Date of Birth: [birth year]           MRN: [id number]             PCP: Mancel Bale, PA-C Referring: Mancel Bale, PA-C Visit Date: 05/22/2019 Occupation: @GUAROCC @  Subjective:  Facial rash   History of Present Illness: Ariana Scott is a 23 y.o. female with history of positive ANA and elevated LFTs.  She states she continues to have facial rash and fatigue.  She has not noticed any joint pain or joint swelling.  She also continues to have dry eyes.  She states she discontinued minocycline about 3 months ago and since then she has been having increased acne.  She has some discomfort in her shoulders.  She continues to have some stiffness in her neck due to trapezius muscle spasm.  She denies any history of oral ulcers or nasal ulcers.  She denies Raynaud's phenomenon.  She gives history of photosensitivity.   Activities of Daily Living:  Patient reports morning stiffness for 1 hour.   Patient Reports nocturnal pain.  Difficulty dressing/grooming: Denies Difficulty climbing stairs: Denies Difficulty getting out of chair: Denies Difficulty using hands for taps, buttons, cutlery, and/or writing: Denies  Review of Systems  Constitutional: Negative for fatigue.  HENT: Negative for mouth sores, mouth dryness and nose dryness.   Eyes: Positive for dryness. Negative for pain and visual disturbance.  Respiratory: Negative for cough, hemoptysis, shortness of breath, wheezing and difficulty breathing.   Cardiovascular: Negative for chest pain, palpitations, hypertension and swelling in legs/feet.  Gastrointestinal: Positive for blood in stool and diarrhea. Negative for constipation.  Endocrine: Negative for increased urination.  Genitourinary: Negative for difficulty urinating and painful urination.  Musculoskeletal: Positive for arthralgias, joint pain and morning stiffness. Negative for joint swelling, myalgias, muscle weakness,  muscle tenderness and myalgias.  Skin: Positive for rash and sensitivity to sunlight. Negative for color change, pallor, hair loss, nodules/bumps, skin tightness and ulcers.  Allergic/Immunologic: Negative for susceptible to infections.  Neurological: Negative for dizziness, headaches, memory loss and weakness.  Hematological: Negative for bruising/bleeding tendency and swollen glands.  Psychiatric/Behavioral: Negative for depressed mood, confusion and sleep disturbance. The patient is not nervous/anxious.     PMFS History:  Patient Active Problem List   Diagnosis Date Noted  . Anxiety 05/01/2019  . Lichen planus 57/84/6962  . Other acne 05/01/2019  . Hypothyroidism (acquired) 01/24/2019    Past Medical History:  Diagnosis Date  . Polycystic ovarian syndrome     Family History  Problem Relation Age of Onset  . Diabetes Mother   . Depression Mother   . ADD / ADHD Mother   . Depression Father   . Vitamin D deficiency Father   . Colitis Father   . Hypertension Father   . High Cholesterol Father   . ADD / ADHD Brother    Past Surgical History:  Procedure Laterality Date  . COLONOSCOPY  2020   Dr. Collene Mares  . UPPER GASTROINTESTINAL ENDOSCOPY  2020   Dr. Collene Mares  . WISDOM TOOTH EXTRACTION  2016   Social History   Social History Narrative  . Not on file   Immunization History  Administered Date(s) Administered  . DTaP 10/16/1995, 11/29/1995, 01/11/1996, 01/10/1997, 04/28/2000  . HPV Quadrivalent 05/21/2007, 07/03/2008  . Hepatitis A 05/21/2007, 07/03/2008  . Hepatitis B 08/31/95, 08/01/1995, 01/11/1996  . HiB (PRP-OMP)  10/16/1995, 11/29/1995, 01/11/1996, 01/10/1997  . IPV 10/16/1995, 11/29/1995, 01/11/1996, 04/28/2000  . MMR 07/02/1996, 04/28/2000  . Meningococcal Conjugate 05/21/2007  . Tdap 04/23/2007     Objective: Vital Signs: BP (!) 136/99 (BP Location: Left Arm, Patient Position: Sitting, Cuff Size: Normal)   Pulse 89   Resp 14   Ht 5' 4.5" (1.638 m)   Wt 199  lb 6.4 oz (90.4 kg)   BMI 33.70 kg/m    Physical Exam Vitals signs and nursing note reviewed.  Constitutional:      Appearance: She is well-developed.  HENT:     Head: Normocephalic and atraumatic.  Eyes:     Conjunctiva/sclera: Conjunctivae normal.  Neck:     Musculoskeletal: Normal range of motion.  Cardiovascular:     Rate and Rhythm: Normal rate and regular rhythm.     Heart sounds: Normal heart sounds.  Pulmonary:     Effort: Pulmonary effort is normal.     Breath sounds: Normal breath sounds.  Abdominal:     General: Bowel sounds are normal.     Palpations: Abdomen is soft.  Lymphadenopathy:     Cervical: No cervical adenopathy.  Skin:    General: Skin is warm and dry.     Capillary Refill: Capillary refill takes less than 2 seconds.     Comments: Fascial erythema all over the face was noted.  Acne was noted on her face.  Neurological:     Mental Status: She is alert and oriented to person, place, and time.  Psychiatric:        Behavior: Behavior normal.      Musculoskeletal Exam: C-spine thoracic and lumbar spine with good range of motion.  Shoulder joints, elbow joints, wrist joints, MCPs PIPs and DIPs with good range of motion with no synovitis.  Hip joints knee joints ankles MTPs PIPs with good range of motion with no synovitis.  CDAI Exam: CDAI Score: - Patient Global: -; Provider Global: - Swollen: -; Tender: - Joint Exam   No joint exam has been documented for this visit   There is currently no information documented on the homunculus. Go to the Rheumatology activity and complete the homunculus joint exam.  Investigation: No additional findings.  Imaging: Xr Cervical Spine 2 Or 3 Views  Result Date: 05/01/2019 No significant disc space narrowing was noted.  No facet joint arthropathy was noted.  Cervical muscle spasm was noted. Impression: Unremarkable x-ray of the cervical spine.  Xr Lumbar Spine 2-3 Views  Result Date: 05/01/2019 No disc space  narrowing was noted.  No facet joint arthropathy was noted.  No SI joint sclerosis was noted.  Mild scoliosis was noted. Impression: Unremarkable x-ray of the lumbar spine.   Recent Labs: Lab Results  Component Value Date   WBC 4.8 05/01/2019   HGB 14.3 05/01/2019   PLT 253 05/01/2019   NA 136 05/01/2019   K 3.9 05/01/2019   CL 104 05/01/2019   CO2 22 05/01/2019   GLUCOSE 96 05/01/2019   BUN 11 05/01/2019   CREATININE 0.72 05/01/2019   BILITOT 0.4 05/01/2019   AST 116 (H) 05/01/2019   ALT 126 (H) 05/01/2019   PROT 7.5 05/01/2019   CALCIUM 9.7 05/01/2019   GFRAA 137 05/01/2019  UA negative, CK 144, ESR 31, ANA 1: 80 speckled, double-stranded DNA 29, Ro positive (SCL 70-, beta-2 negative, anticardiolipin IgA 13) lupus anticoagulant negative, C3-C4 normal  Speciality Comments: No specialty comments available.  Procedures:  No procedures performed Allergies: Kiwi extract  and Pineapple   Assessment / Plan:     Visit Diagnoses: Positive ANA (antinuclear antibody) - Positive ANA, double-stranded DNA, positive Ro, history of photosensitivity, facial rash and elevated LFTs.  Minocycline was discontinued approximately 3 months ago.  Her symptoms persist.  As she has multiple antibodies and positive double-stranded DNA we discussed possible use of Plaquenil.  Indication side effects contraindications were discussed at length.  Patient was in agreement.  Informed consent was taken.  The plan is to start him on Plaquenil 200 mg twice daily Monday to Friday.  We will check eye exam baseline and then every year.  It is possible that over time her autoimmune antibodies resolving her symptoms improved.  In that case we will taper off Plaquenil.-Discussed this at length with the patient.  She is willing to give Plaquenil a try.  We will check her labs in a month along with the antibody levels.  Elevated LFTs-I discussed lab results with Dr. Collene Mares.  We also discussed if prednisone taper is needed.  At  this point we felt that prednisone taper is not required and we will see how she responds to Plaquenil.  Fatty liver-  Neck pain - X-ray of the cervical spine was unremarkable.  Chronic midline low back pain without sciatica - X-ray of the lumbar spine was unremarkable.  Mild scoliosis was noted.  Other acne - Treated with minocycline.  She is on tretinoin.  Which is not as effective as minocycline.  She is having some more acne.  History of hypothyroidism  Other fatigue  Anxiety  Lichen planus - labial . Ttd with topical steroids.  Orders: Orders Placed This Encounter  Procedures  . CBC with Differential/Platelet  . COMPLETE METABOLIC PANEL WITH GFR   Meds ordered this encounter  Medications  . hydroxychloroquine (PLAQUENIL) 200 MG tablet    Sig: Take 1 tablet (200 mg total) by mouth 2 (two) times daily.    Dispense:  180 tablet    Refill:  0    Face-to-face time spent with patient was 30 minutes. Greater than 50% of time was spent in counseling and coordination of care.  Follow-Up Instructions: Return in about 4 weeks (around 06/19/2019) for Positive ANA.   Bo Merino, MD  Note - This record has been created using Editor, commissioning.  Chart creation errors have been sought, but may not always  have been located. Such creation errors do not reflect on  the standard of medical care.

## 2019-05-10 NOTE — Telephone Encounter (Signed)
Please review and advise.

## 2019-05-15 ENCOUNTER — Telehealth: Payer: Self-pay | Admitting: Internal Medicine

## 2019-05-15 NOTE — Telephone Encounter (Signed)
Recv'd records from [facility] forwarded 15 pages to Dr. Philemon Kingdom 9/2/20fbg

## 2019-05-22 ENCOUNTER — Ambulatory Visit: Payer: Managed Care, Other (non HMO) | Admitting: Rheumatology

## 2019-05-22 ENCOUNTER — Other Ambulatory Visit: Payer: Self-pay

## 2019-05-22 ENCOUNTER — Encounter: Payer: Self-pay | Admitting: Rheumatology

## 2019-05-22 VITALS — BP 136/99 | HR 89 | Resp 14 | Ht 64.5 in | Wt 199.4 lb

## 2019-05-22 DIAGNOSIS — M542 Cervicalgia: Secondary | ICD-10-CM | POA: Diagnosis not present

## 2019-05-22 DIAGNOSIS — F419 Anxiety disorder, unspecified: Secondary | ICD-10-CM

## 2019-05-22 DIAGNOSIS — G8929 Other chronic pain: Secondary | ICD-10-CM

## 2019-05-22 DIAGNOSIS — R7989 Other specified abnormal findings of blood chemistry: Secondary | ICD-10-CM

## 2019-05-22 DIAGNOSIS — R945 Abnormal results of liver function studies: Secondary | ICD-10-CM | POA: Diagnosis not present

## 2019-05-22 DIAGNOSIS — R5383 Other fatigue: Secondary | ICD-10-CM

## 2019-05-22 DIAGNOSIS — R768 Other specified abnormal immunological findings in serum: Secondary | ICD-10-CM | POA: Diagnosis not present

## 2019-05-22 DIAGNOSIS — M545 Low back pain, unspecified: Secondary | ICD-10-CM

## 2019-05-22 DIAGNOSIS — Z79899 Other long term (current) drug therapy: Secondary | ICD-10-CM

## 2019-05-22 DIAGNOSIS — L708 Other acne: Secondary | ICD-10-CM

## 2019-05-22 DIAGNOSIS — K760 Fatty (change of) liver, not elsewhere classified: Secondary | ICD-10-CM | POA: Diagnosis not present

## 2019-05-22 DIAGNOSIS — L439 Lichen planus, unspecified: Secondary | ICD-10-CM

## 2019-05-22 DIAGNOSIS — Z8639 Personal history of other endocrine, nutritional and metabolic disease: Secondary | ICD-10-CM

## 2019-05-22 MED ORDER — HYDROXYCHLOROQUINE SULFATE 200 MG PO TABS: 200.0000 mg | ORAL_TABLET | Freq: Two times a day (BID) | ORAL | 0 refills | Status: DC

## 2019-05-22 NOTE — Patient Instructions (Signed)
Hydroxychloroquine tablets What is this medicine? HYDROXYCHLOROQUINE (hye drox ee KLOR oh kwin) is used to treat rheumatoid arthritis and systemic lupus erythematosus. It is also used to treat malaria. This medicine may be used for other purposes; ask your health care provider or pharmacist if you have questions. COMMON BRAND NAME(S): Plaquenil, Quineprox What should I tell my health care provider before I take this medicine? They need to know if you have any of these conditions:  diabetes  eye disease, vision problems  G6PD deficiency  heart disease  history of irregular heartbeat  if you often drink alcohol  kidney disease  liver disease  porphyria  psoriasis  an unusual or allergic reaction to chloroquine, hydroxychloroquine, other medicines, foods, dyes, or preservatives  pregnant or trying to get pregnant  breast-feeding How should I use this medicine? Take this medicine by mouth with a glass of water. Follow the directions on the prescription label. Do not cut, crush or chew this medicine. Swallow the tablets whole. Take this medicine with food. Avoid taking antacids within 4 hours of taking this medicine. It is best to separate these medicines by at least 4 hours. Take your medicine at regular intervals. Do not take it more often than directed. Take all of your medicine as directed even if you think you are better. Do not skip doses or stop your medicine early. Talk to your pediatrician regarding the use of this medicine in children. While this drug may be prescribed for selected conditions, precautions do apply. Overdosage: If you think you have taken too much of this medicine contact a poison control center or emergency room at once. NOTE: This medicine is only for you. Do not share this medicine with others. What if I miss a dose? If you miss a dose, take it as soon as you can. If it is almost time for your next dose, take only that dose. Do not take double or extra  doses. What may interact with this medicine? Do not take this medicine with any of the following medications:  cisapride  dronedarone  pimozide  thioridazine This medicine may also interact with the following medications:  ampicillin  antacids  cimetidine  cyclosporine  digoxin  kaolin  medicines for diabetes, like insulin, glipizide, glyburide  medicines for seizures like carbamazepine, phenobarbital, phenytoin  mefloquine  methotrexate  other medicines that prolong the QT interval (cause an abnormal heart rhythm)  praziquantel This list may not describe all possible interactions. Give your health care provider a list of all the medicines, herbs, non-prescription drugs, or dietary supplements you use. Also tell them if you smoke, drink alcohol, or use illegal drugs. Some items may interact with your medicine. What should I watch for while using this medicine? Visit your health care professional for regular checks on your progress. Tell your health care professional if your symptoms do not start to get better or if they get worse. You may need blood work done while you are taking this medicine. If you take other medicines that can affect heart rhythm, you may need more testing. Talk to your health care professional if you have questions. Your vision may be tested before and during use of this medicine. Tell your health care professional right away if you have any change in your eyesight. What side effects may I notice from receiving this medicine? Side effects that you should report to your doctor or health care professional as soon as possible:  allergic reactions like skin rash, itching or hives,   swelling of the face, lips, or tongue  changes in vision  decreased hearing or ringing of the ears  muscle weakness  redness, blistering, peeling or loosening of the skin, including inside the mouth  sensitivity to light  signs and symptoms of a dangerous change in  heartbeat or heart rhythm like chest pain; dizziness; fast or irregular heartbeat; palpitations; feeling faint or lightheaded, falls; breathing problems  signs and symptoms of liver injury like dark yellow or brown urine; general ill feeling or flu-like symptoms; light-colored stools; loss of appetite; nausea; right upper belly pain; unusually weak or tired; yellowing of the eyes or skin  signs and symptoms of low blood sugar such as feeling anxious; confusion; dizziness; increased hunger; unusually weak or tired; sweating; shakiness; cold; irritable; headache; blurred vision; fast heartbeat; loss of consciousness  suicidal thoughts  uncontrollable head, mouth, neck, arm, or leg movements Side effects that usually do not require medical attention (report to your doctor or health care professional if they continue or are bothersome):  diarrhea  dizziness  hair loss  headache  irritable  loss of appetite  nausea, vomiting  stomach pain This list may not describe all possible side effects. Call your doctor for medical advice about side effects. You may report side effects to FDA at 1-800-FDA-1088. Where should I keep my medicine? Keep out of the reach of children. Store at room temperature between 15 and 30 degrees C (59 and 86 degrees F). Protect from moisture and light. Throw away any unused medicine after the expiration date. NOTE: This sheet is a summary. It may not cover all possible information. If you have questions about this medicine, talk to your doctor, pharmacist, or health care provider.  2020 Elsevier/Gold Standard (2019-01-07 12:56:32)  

## 2019-05-22 NOTE — Progress Notes (Signed)
Pharmacy Note  Subjective: Patient presents today to the [facility] to see Dr. Estanislado Pandy.  Patient seen by the pharmacist for counseling on hydroxychloroquine systemic lupus erythematosus.  She is naive to therapy.  Objective: CMP     Component Value Date/Time   NA 136 05/01/2019 1113   K 3.9 05/01/2019 1113   CL 104 05/01/2019 1113   CO2 22 05/01/2019 1113   GLUCOSE 96 05/01/2019 1113   BUN 11 05/01/2019 1113   CREATININE 0.72 05/01/2019 1113   CALCIUM 9.7 05/01/2019 1113   PROT 7.5 05/01/2019 1113   AST 116 (H) 05/01/2019 1113   ALT 126 (H) 05/01/2019 1113   BILITOT 0.4 05/01/2019 1113   GFRNONAA 118 05/01/2019 1113   GFRAA 137 05/01/2019 1113    CBC    Component Value Date/Time   WBC 4.8 05/01/2019 1113   RBC 4.74 05/01/2019 1113   HGB 14.3 05/01/2019 1113   HCT 42.3 05/01/2019 1113   PLT 253 05/01/2019 1113   MCV 89.2 05/01/2019 1113   MCH 30.2 05/01/2019 1113   MCHC 33.8 05/01/2019 1113   RDW 12.9 05/01/2019 1113   LYMPHSABS 1,790 05/01/2019 1113   EOSABS 19 05/01/2019 1113   BASOSABS 10 05/01/2019 1113    Assessment/Plan: Patient was counseled on the purpose, proper use, and adverse effects of hydroxychloroquine including nausea/diarrhea, skin rash, headaches, and sun sensitivity.  Discussed importance of annual eye exams while on hydroxychloroquine to monitor to ocular toxicity and discussed importance of frequent laboratory monitoring.  Provided patient with eye exam form for baseline ophthalmologic exam and standing lab instructions.  Provided patient with educational materials on hydroxychloroquine and answered all questions.  Patient consented to hydroxychloroquine.  Will upload consent in the media tab.    Dose will be Plaquenil 200 mg twice daily based on weight of 90.4 kg, height 5' 4.5", and eGFR 118.    All questions encouraged and answered.  Instructed patient to call with any other questions or concerns.  Mariella Saa, PharmD, [place],  [facility] Pharmacist [phone]  05/22/2019 3:57 PM

## 2019-05-28 ENCOUNTER — Ambulatory Visit: Payer: Managed Care, Other (non HMO) | Admitting: Internal Medicine

## 2019-06-03 NOTE — Progress Notes (Deleted)
Office Visit Note  Patient: Ariana Scott             Date of Birth: 10/18/1995           MRN: [id number]             PCP: Mancel Bale, PA-C Referring: Mancel Bale, PA-C Visit Date: 06/17/2019 Occupation: '@GUAROCC'$ @  Subjective:  No chief complaint on file.   History of Present Illness: Ariana Scott is a 23 y.o. female ***   Activities of Daily Living:  Patient reports morning stiffness for *** {minute/hour:19697}.   Patient {ACTIONS;DENIES/REPORTS:21021675::"Denies"} nocturnal pain.  Difficulty dressing/grooming: {ACTIONS;DENIES/REPORTS:21021675::"Denies"} Difficulty climbing stairs: {ACTIONS;DENIES/REPORTS:21021675::"Denies"} Difficulty getting out of chair: {ACTIONS;DENIES/REPORTS:21021675::"Denies"} Difficulty using hands for taps, buttons, cutlery, and/or writing: {ACTIONS;DENIES/REPORTS:21021675::"Denies"}  No Rheumatology ROS completed.   PMFS History:  Patient Active Problem List   Diagnosis Date Noted  . Anxiety 05/01/2019  . Lichen planus 40/98/1191  . Other acne 05/01/2019  . Hypothyroidism (acquired) 01/24/2019    Past Medical History:  Diagnosis Date  . Polycystic ovarian syndrome     Family History  Problem Relation Age of Onset  . Diabetes Mother   . Depression Mother   . ADD / ADHD Mother   . Depression Father   . Vitamin D deficiency Father   . Colitis Father   . Hypertension Father   . High Cholesterol Father   . ADD / ADHD Brother    Past Surgical History:  Procedure Laterality Date  . COLONOSCOPY  2020   Dr. Collene Mares  . UPPER GASTROINTESTINAL ENDOSCOPY  2020   Dr. Collene Mares  . WISDOM TOOTH EXTRACTION  2016   Social History   Social History Narrative  . Not on file   Immunization History  Administered Date(s) Administered  . DTaP 10/16/1995, 11/29/1995, 01/11/1996, 01/10/1997, 04/28/2000  . HPV Quadrivalent 05/21/2007, 07/03/2008  . Hepatitis A 05/21/2007, 07/03/2008  . Hepatitis B 08-30-95, 08/01/1995, 01/11/1996  . HiB  (PRP-OMP) 10/16/1995, 11/29/1995, 01/11/1996, 01/10/1997  . IPV 10/16/1995, 11/29/1995, 01/11/1996, 04/28/2000  . MMR 07/02/1996, 04/28/2000  . Meningococcal Conjugate 05/21/2007  . Tdap 04/23/2007     Objective: Vital Signs: There were no vitals taken for this visit.   Physical Exam   Musculoskeletal Exam: ***  CDAI Exam: CDAI Score: - Patient Global: -; Provider Global: - Swollen: -; Tender: - Joint Exam   No joint exam has been documented for this visit   There is currently no information documented on the homunculus. Go to the Rheumatology activity and complete the homunculus joint exam.  Investigation: No additional findings.  Imaging: No results found.  Recent Labs: Lab Results  Component Value Date   WBC 4.8 05/01/2019   HGB 14.3 05/01/2019   PLT 253 05/01/2019   NA 136 05/01/2019   K 3.9 05/01/2019   CL 104 05/01/2019   CO2 22 05/01/2019   GLUCOSE 96 05/01/2019   BUN 11 05/01/2019   CREATININE 0.72 05/01/2019   BILITOT 0.4 05/01/2019   AST 116 (H) 05/01/2019   ALT 126 (H) 05/01/2019   PROT 7.5 05/01/2019   CALCIUM 9.7 05/01/2019   GFRAA 137 05/01/2019    Speciality Comments: No specialty comments available.  Procedures:  No procedures performed Allergies: Kiwi extract and Pineapple   Assessment / Plan:     Visit Diagnoses: No diagnosis found.  Orders: No orders of the defined types were placed in this encounter.  No orders of the defined types were placed in this encounter.   Face-to-face  time spent with patient was *** minutes. Greater than 50% of time was spent in counseling and coordination of care.  Follow-Up Instructions: No follow-ups on file.   Athenia Rys C Jojo Geving, CMA  Note - This record has been created using Dragon software.  Chart creation errors have been sought, but may not always  have been located. Such creation errors do not reflect on  the standard of medical care. 

## 2019-06-04 ENCOUNTER — Telehealth: Payer: Self-pay | Admitting: Rheumatology

## 2019-06-04 NOTE — Telephone Encounter (Signed)
Patient left a voicemail stating on 05/22/19 Dr. Estanislado Pandy prescribed Hydroxychloroquine.  [facility] states that her insurance has not responded with my coverage.  Patient states [facility] told her to contact Dr. Estanislado Pandy to see if the prescription was sent to her insurance.  Patient states [facility] told her the cost for the prescription is $300 without insurance.  Patient is requesting a return call.

## 2019-06-04 NOTE — Telephone Encounter (Signed)
Patient advised prescription has been approved through her insurance. Patient advised she may contact the pharmacy and pick it up today. Patient has reschedule her appointment until later in October.

## 2019-06-17 ENCOUNTER — Ambulatory Visit: Payer: Managed Care, Other (non HMO) | Admitting: Rheumatology

## 2019-06-18 ENCOUNTER — Other Ambulatory Visit: Payer: Self-pay

## 2019-06-18 ENCOUNTER — Other Ambulatory Visit (INDEPENDENT_AMBULATORY_CARE_PROVIDER_SITE_OTHER): Payer: Managed Care, Other (non HMO)

## 2019-06-18 ENCOUNTER — Other Ambulatory Visit: Payer: Self-pay | Admitting: Internal Medicine

## 2019-06-18 DIAGNOSIS — E038 Other specified hypothyroidism: Secondary | ICD-10-CM | POA: Diagnosis not present

## 2019-06-18 DIAGNOSIS — E063 Autoimmune thyroiditis: Secondary | ICD-10-CM

## 2019-06-18 LAB — T4, FREE: Free T4: 0.82 ng/dL (ref 0.60–1.60)

## 2019-06-18 LAB — TSH: TSH: 5.72 u[IU]/mL — ABNORMAL HIGH (ref 0.35–4.50)

## 2019-06-24 NOTE — Progress Notes (Signed)
Office Visit Note  Patient: Ariana Scott             Date of Birth: December 11, 1995           MRN: [id number]             PCP: Mancel Bale, PA-C Referring: Mancel Bale, PA-C Visit Date: 07/08/2019 Occupation: @GUAROCC @  Subjective:  Pain in joints.   History of Present Illness: Ariana Scott is a 23 y.o. female with history of autoimmune disease.  She states she felt much better after starting Plaquenil as regards to fatigue.  Although the discomfort she has had in her shoulders and lower back since her childhood that has not resolved.  She has noticed some improvement in her facial rash and photosensitivity.  She is curious about her liver functions.  She denies any joint swelling.  Activities of Daily Living:  Patient reports morning stiffness for 24 hours.   Patient Reports nocturnal pain.  Difficulty dressing/grooming: Denies Difficulty climbing stairs: Denies Difficulty getting out of chair: Denies Difficulty using hands for taps, buttons, cutlery, and/or writing: Denies  Review of Systems  Constitutional: Negative for fatigue, night sweats, weight gain and weight loss.  HENT: Negative for mouth sores, trouble swallowing, trouble swallowing, mouth dryness and nose dryness.   Eyes: Positive for dryness. Negative for pain, redness, itching and visual disturbance.  Respiratory: Negative for cough, shortness of breath and difficulty breathing.   Cardiovascular: Negative for chest pain, palpitations, hypertension, irregular heartbeat and swelling in legs/feet.  Gastrointestinal: Negative for blood in stool, constipation and diarrhea.  Endocrine: Negative for increased urination.  Genitourinary: Negative for difficulty urinating, painful urination and vaginal dryness.  Musculoskeletal: Positive for arthralgias, joint pain and morning stiffness. Negative for joint swelling, myalgias, muscle weakness, muscle tenderness and myalgias.  Skin: Positive for rash and redness. Negative  for color change, hair loss, skin tightness, ulcers and sensitivity to sunlight.  Allergic/Immunologic: Negative for susceptible to infections.  Neurological: Positive for numbness. Negative for dizziness, headaches, memory loss, night sweats and weakness.  Hematological: Negative for bruising/bleeding tendency and swollen glands.  Psychiatric/Behavioral: Negative for depressed mood and sleep disturbance. The patient is not nervous/anxious.     PMFS History:  Patient Active Problem List   Diagnosis Date Noted  . Chronic midline low back pain without sciatica 07/08/2019  . Elevated LFTs 07/08/2019  . Fatty liver 07/08/2019  . Autoimmune disease ([place]) 07/08/2019  . Anxiety 05/01/2019  . Lichen planus 82/50/5397  . Other acne 05/01/2019  . Hypothyroidism (acquired) 01/24/2019    Past Medical History:  Diagnosis Date  . Polycystic ovarian syndrome     Family History  Problem Relation Age of Onset  . Diabetes Mother   . Depression Mother   . ADD / ADHD Mother   . Depression Father   . Vitamin D deficiency Father   . Colitis Father   . Hypertension Father   . High Cholesterol Father   . ADD / ADHD Brother    Past Surgical History:  Procedure Laterality Date  . COLONOSCOPY  2020   Dr. Collene Mares  . UPPER GASTROINTESTINAL ENDOSCOPY  2020   Dr. Collene Mares  . WISDOM TOOTH EXTRACTION  2016   Social History   Social History Narrative  . Not on file   Immunization History  Administered Date(s) Administered  . DTaP 10/16/1995, 11/29/1995, 01/11/1996, 01/10/1997, 04/28/2000  . HPV Quadrivalent 05/21/2007, 07/03/2008  . Hepatitis A 05/21/2007, 07/03/2008  . Hepatitis B 06-06-1995, 08/01/1995,  01/11/1996  . HiB (PRP-OMP) 10/16/1995, 11/29/1995, 01/11/1996, 01/10/1997  . IPV 10/16/1995, 11/29/1995, 01/11/1996, 04/28/2000  . MMR 07/02/1996, 04/28/2000  . Meningococcal Conjugate 05/21/2007  . Tdap 04/23/2007     Objective: Vital Signs: BP 125/89 (BP Location: Left Arm, Patient Position:  Sitting, Cuff Size: Normal)   Pulse 77   Resp 15   Ht 5' 4.5" (1.638 m)   Wt 201 lb 12.8 oz (91.5 kg)   BMI 34.10 kg/m    Physical Exam Vitals signs and nursing note reviewed.  Constitutional:      Appearance: She is well-developed.  HENT:     Head: Normocephalic and atraumatic.  Eyes:     Conjunctiva/sclera: Conjunctivae normal.  Neck:     Musculoskeletal: Normal range of motion.  Cardiovascular:     Rate and Rhythm: Normal rate and regular rhythm.     Heart sounds: Normal heart sounds.  Pulmonary:     Effort: Pulmonary effort is normal.     Breath sounds: Normal breath sounds.  Abdominal:     General: Bowel sounds are normal.     Palpations: Abdomen is soft.  Lymphadenopathy:     Cervical: No cervical adenopathy.  Skin:    General: Skin is warm and dry.     Capillary Refill: Capillary refill takes less than 2 seconds.  Neurological:     Mental Status: She is alert and oriented to person, place, and time.  Psychiatric:        Behavior: Behavior normal.      Musculoskeletal Exam: C-spine thoracic and lumbar spine were in good range of motion.  She has discomfort range of motion of her lumbar spine.  She has discomfort of motion of her shoulder joints.  Elbow joints wrist joint MCPs PIPs DIPs with good range of motion.  Hip joints, knee joints, ankles, MTPs PIPs and DIPs with good range of motion with no synovitis.  CDAI Exam: CDAI Score: - Patient Global: -; Provider Global: - Swollen: -; Tender: - Joint Exam   No joint exam has been documented for this visit   There is currently no information documented on the homunculus. Go to the Rheumatology activity and complete the homunculus joint exam.  Investigation: No additional findings.  Imaging: No results found.  Recent Labs: Lab Results  Component Value Date   WBC 4.8 05/01/2019   HGB 14.3 05/01/2019   PLT 253 05/01/2019   NA 136 05/01/2019   K 3.9 05/01/2019   CL 104 05/01/2019   CO2 22 05/01/2019    GLUCOSE 96 05/01/2019   BUN 11 05/01/2019   CREATININE 0.72 05/01/2019   BILITOT 0.4 05/01/2019   AST 116 (H) 05/01/2019   ALT 126 (H) 05/01/2019   PROT 7.5 05/01/2019   CALCIUM 9.7 05/01/2019   GFRAA 137 05/01/2019    Speciality Comments: PLQ eye exam: 06/25/2019 normal. [facility]. Follow up in 6 months.  Procedures:  No procedures performed Allergies: Kiwi extract and Pineapple   Assessment / Plan:     Visit Diagnoses: Autoimmune disease ([place]) - Positive ANA, double-stranded DNA, positive Ro, history of photosensitivity, facial rash and elevated LFTs.  Patient has noticed improvement in fatigue since she started taking Plaquenil.  She has had less photosensitivity.  She continues to have some sicca symptoms.  She has been using over-the-counter products.  High risk medication use - Plaquenil 200 mg 1 tablet twice daily started in September 2020.  No baseline Plaquenil eye exam on file.  Most recent CBC/CMP  within normal limits except for elevated AST/ALT on 05/01/2019.  Due for CBC/CMP today and will monitor every 3 months.   Elevated LFTs-I am uncertain if the elevation of LFTs is related to fatty liver or autoimmune disease.  We will check her labs today.  Fatty liver  Neck pain - X-ray of the cervical spine was unremarkable.  Chronic midline low back pain without sciatica -  X-ray of the lumbar spine was unremarkable.  Mild scoliosis was noted.  Other fatigue-improved  Other medical problems are listed as follows:  Lichen planus  History of hypothyroidism  Anxiety  Other acne  Orders: Orders Placed This Encounter  Procedures  . CBC with Differential/Platelet  . COMPLETE METABOLIC PANEL WITH GFR   No orders of the defined types were placed in this encounter.     Follow-Up Instructions: Return in about 2 months (around 09/07/2019) for Autoimmune disease.   Bo Merino, MD  Note - This record has been created using Editor, commissioning.  Chart creation  errors have been sought, but may not always  have been located. Such creation errors do not reflect on  the standard of medical care.

## 2019-07-08 ENCOUNTER — Encounter: Payer: Self-pay | Admitting: Rheumatology

## 2019-07-08 ENCOUNTER — Other Ambulatory Visit: Payer: Self-pay

## 2019-07-08 ENCOUNTER — Ambulatory Visit (INDEPENDENT_AMBULATORY_CARE_PROVIDER_SITE_OTHER): Payer: Managed Care, Other (non HMO) | Admitting: Rheumatology

## 2019-07-08 VITALS — BP 125/89 | HR 77 | Resp 15 | Ht 64.5 in | Wt 201.8 lb

## 2019-07-08 DIAGNOSIS — K760 Fatty (change of) liver, not elsewhere classified: Secondary | ICD-10-CM

## 2019-07-08 DIAGNOSIS — R5383 Other fatigue: Secondary | ICD-10-CM

## 2019-07-08 DIAGNOSIS — R7989 Other specified abnormal findings of blood chemistry: Secondary | ICD-10-CM

## 2019-07-08 DIAGNOSIS — M359 Systemic involvement of connective tissue, unspecified: Secondary | ICD-10-CM | POA: Diagnosis not present

## 2019-07-08 DIAGNOSIS — M542 Cervicalgia: Secondary | ICD-10-CM

## 2019-07-08 DIAGNOSIS — M545 Low back pain, unspecified: Secondary | ICD-10-CM

## 2019-07-08 DIAGNOSIS — G8929 Other chronic pain: Secondary | ICD-10-CM

## 2019-07-08 DIAGNOSIS — L439 Lichen planus, unspecified: Secondary | ICD-10-CM

## 2019-07-08 DIAGNOSIS — Z79899 Other long term (current) drug therapy: Secondary | ICD-10-CM

## 2019-07-08 DIAGNOSIS — Z8639 Personal history of other endocrine, nutritional and metabolic disease: Secondary | ICD-10-CM

## 2019-07-08 DIAGNOSIS — F419 Anxiety disorder, unspecified: Secondary | ICD-10-CM

## 2019-07-08 DIAGNOSIS — L708 Other acne: Secondary | ICD-10-CM

## 2019-07-08 NOTE — Patient Instructions (Signed)
Shoulder Exercises Ask your health care provider which exercises are safe for you. Do exercises exactly as told by your health care provider and adjust them as directed. It is normal to feel mild stretching, pulling, tightness, or discomfort as you do these exercises. Stop right away if you feel sudden pain or your pain gets worse. Do not begin these exercises until told by your health care provider. Stretching exercises External rotation and abduction This exercise is sometimes called corner stretch. This exercise rotates your arm outward (external rotation) and moves your arm out from your body (abduction). 1. Stand in a doorway with one of your feet slightly in front of the other. This is called a staggered stance. If you cannot reach your forearms to the door frame, stand facing a corner of a room. 2. Choose one of the following positions as told by your health care provider: ? Place your hands and forearms on the door frame above your head. ? Place your hands and forearms on the door frame at the height of your head. ? Place your hands on the door frame at the height of your elbows. 3. Slowly move your weight onto your front foot until you feel a stretch across your chest and in the front of your shoulders. Keep your head and chest upright and keep your abdominal muscles tight. 4. Hold for __________ seconds. 5. To release the stretch, shift your weight to your back foot. Repeat __________ times. Complete this exercise __________ times a day. Extension, standing 1. Stand and hold a broomstick, a cane, or a similar object behind your back. ? Your hands should be a little wider than shoulder width apart. ? Your palms should face away from your back. 2. Keeping your elbows straight and your shoulder muscles relaxed, move the stick away from your body until you feel a stretch in your shoulders (extension). ? Avoid shrugging your shoulders while you move the stick. Keep your shoulder blades tucked  down toward the middle of your back. 3. Hold for __________ seconds. 4. Slowly return to the starting position. Repeat __________ times. Complete this exercise __________ times a day. Range-of-motion exercises Pendulum  1. Stand near a wall or a surface that you can hold onto for balance. 2. Bend at the waist and let your left / right arm hang straight down. Use your other arm to support you. Keep your back straight and do not lock your knees. 3. Relax your left / right arm and shoulder muscles, and move your hips and your trunk so your left / right arm swings freely. Your arm should swing because of the motion of your body, not because you are using your arm or shoulder muscles. 4. Keep moving your hips and trunk so your arm swings in the following directions, as told by your health care provider: ? Side to side. ? Forward and backward. ? In clockwise and counterclockwise circles. 5. Continue each motion for __________ seconds, or for as long as told by your health care provider. 6. Slowly return to the starting position. Repeat __________ times. Complete this exercise __________ times a day. Shoulder flexion, standing  1. Stand and hold a broomstick, a cane, or a similar object. Place your hands a little more than shoulder width apart on the object. Your left / right hand should be palm up, and your other hand should be palm down. 2. Keep your elbow straight and your shoulder muscles relaxed. Push the stick up with your healthy arm to   raise your left / right arm in front of your body, and then over your head until you feel a stretch in your shoulder (flexion). ? Avoid shrugging your shoulder while you raise your arm. Keep your shoulder blade tucked down toward the middle of your back. 3. Hold for __________ seconds. 4. Slowly return to the starting position. Repeat __________ times. Complete this exercise __________ times a day. Shoulder abduction, standing 1. Stand and hold a broomstick,  a cane, or a similar object. Place your hands a little more than shoulder width apart on the object. Your left / right hand should be palm up, and your other hand should be palm down. 2. Keep your elbow straight and your shoulder muscles relaxed. Push the object across your body toward your left / right side. Raise your left / right arm to the side of your body (abduction) until you feel a stretch in your shoulder. ? Do not raise your arm above shoulder height unless your health care provider tells you to do that. ? If directed, raise your arm over your head. ? Avoid shrugging your shoulder while you raise your arm. Keep your shoulder blade tucked down toward the middle of your back. 3. Hold for __________ seconds. 4. Slowly return to the starting position. Repeat __________ times. Complete this exercise __________ times a day. Internal rotation  1. Place your left / right hand behind your back, palm up. 2. Use your other hand to dangle an exercise band, a towel, or a similar object over your shoulder. Grasp the band with your left / right hand so you are holding on to both ends. 3. Gently pull up on the band until you feel a stretch in the front of your left / right shoulder. The movement of your arm toward the center of your body is called internal rotation. ? Avoid shrugging your shoulder while you raise your arm. Keep your shoulder blade tucked down toward the middle of your back. 4. Hold for __________ seconds. 5. Release the stretch by letting go of the band and lowering your hands. Repeat __________ times. Complete this exercise __________ times a day. Strengthening exercises External rotation  1. Sit in a stable chair without armrests. 2. Secure an exercise band to a stable object at elbow height on your left / right side. 3. Place a soft object, such as a folded towel or a small pillow, between your left / right upper arm and your body to move your elbow about 4 inches (10 cm) away  from your side. 4. Hold the end of the exercise band so it is tight and there is no slack. 5. Keeping your elbow pressed against the soft object, slowly move your forearm out, away from your abdomen (external rotation). Keep your body steady so only your forearm moves. 6. Hold for __________ seconds. 7. Slowly return to the starting position. Repeat __________ times. Complete this exercise __________ times a day. Shoulder abduction  1. Sit in a stable chair without armrests, or stand up. 2. Hold a __________ weight in your left / right hand, or hold an exercise band with both hands. 3. Start with your arms straight down and your left / right palm facing in, toward your body. 4. Slowly lift your left / right hand out to your side (abduction). Do not lift your hand above shoulder height unless your health care provider tells you that this is safe. ? Keep your arms straight. ? Avoid shrugging your shoulder while you   do this movement. Keep your shoulder blade tucked down toward the middle of your back. 5. Hold for __________ seconds. 6. Slowly lower your arm, and return to the starting position. Repeat __________ times. Complete this exercise __________ times a day. Shoulder extension 1. Sit in a stable chair without armrests, or stand up. 2. Secure an exercise band to a stable object in front of you so it is at shoulder height. 3. Hold one end of the exercise band in each hand. Your palms should face each other. 4. Straighten your elbows and lift your hands up to shoulder height. 5. Step back, away from the secured end of the exercise band, until the band is tight and there is no slack. 6. Squeeze your shoulder blades together as you pull your hands down to the sides of your thighs (extension). Stop when your hands are straight down by your sides. Do not let your hands go behind your body. 7. Hold for __________ seconds. 8. Slowly return to the starting position. Repeat __________ times.  Complete this exercise __________ times a day. Shoulder row 1. Sit in a stable chair without armrests, or stand up. 2. Secure an exercise band to a stable object in front of you so it is at waist height. 3. Hold one end of the exercise band in each hand. Position your palms so that your thumbs are facing the ceiling (neutral position). 4. Bend each of your elbows to a 90-degree angle (right angle) and keep your upper arms at your sides. 5. Step back until the band is tight and there is no slack. 6. Slowly pull your elbows back behind you. 7. Hold for __________ seconds. 8. Slowly return to the starting position. Repeat __________ times. Complete this exercise __________ times a day. Shoulder press-ups  1. Sit in a stable chair that has armrests. Sit upright, with your feet flat on the floor. 2. Put your hands on the armrests so your elbows are bent and your fingers are pointing forward. Your hands should be about even with the sides of your body. 3. Push down on the armrests and use your arms to lift yourself off the chair. Straighten your elbows and lift yourself up as much as you comfortably can. ? Move your shoulder blades down, and avoid letting your shoulders move up toward your ears. ? Keep your feet on the ground. As you get stronger, your feet should support less of your body weight as you lift yourself up. 4. Hold for __________ seconds. 5. Slowly lower yourself back into the chair. Repeat __________ times. Complete this exercise __________ times a day. Wall push-ups  1. Stand so you are facing a stable wall. Your feet should be about one arm-length away from the wall. 2. Lean forward and place your palms on the wall at shoulder height. 3. Keep your feet flat on the floor as you bend your elbows and lean forward toward the wall. 4. Hold for __________ seconds. 5. Straighten your elbows to push yourself back to the starting position. Repeat __________ times. Complete this exercise  __________ times a day. This information is not intended to replace advice given to you by your health care provider. Make sure you discuss any questions you have with your health care provider. Document Released: 07/13/2005 Document Revised: 12/21/2018 Document Reviewed: 09/28/2018 Elsevier Patient Education  2020 [employer]. Back Exercises The following exercises strengthen the muscles that help to support the trunk and back. They also help to keep the lower back  flexible. Doing these exercises can help to prevent back pain or lessen existing pain.  If you have back pain or discomfort, try doing these exercises 2-3 times each day or as told by your health care provider.  As your pain improves, do them once each day, but increase the number of times that you repeat the steps for each exercise (do more repetitions).  To prevent the recurrence of back pain, continue to do these exercises once each day or as told by your health care provider. Do exercises exactly as told by your health care provider and adjust them as directed. It is normal to feel mild stretching, pulling, tightness, or discomfort as you do these exercises, but you should stop right away if you feel sudden pain or your pain gets worse. Exercises Single knee to chest Repeat these steps 3-5 times for each leg: 1. Lie on your back on a firm bed or the floor with your legs extended. 2. Bring one knee to your chest. Your other leg should stay extended and in contact with the floor. 3. Hold your knee in place by grabbing your knee or thigh with both hands and hold. 4. Pull on your knee until you feel a gentle stretch in your lower back or buttocks. 5. Hold the stretch for 10-30 seconds. 6. Slowly release and straighten your leg. Pelvic tilt Repeat these steps 5-10 times: 1. Lie on your back on a firm bed or the floor with your legs extended. 2. Bend your knees so they are pointing toward the ceiling and your feet are flat on the  floor. 3. Tighten your lower abdominal muscles to press your lower back against the floor. This motion will tilt your pelvis so your tailbone points up toward the ceiling instead of pointing to your feet or the floor. 4. With gentle tension and even breathing, hold this position for 5-10 seconds. Cat-cow Repeat these steps until your lower back becomes more flexible: 1. Get into a hands-and-knees position on a firm surface. Keep your hands under your shoulders, and keep your knees under your hips. You may place padding under your knees for comfort. 2. Let your head hang down toward your chest. Contract your abdominal muscles and point your tailbone toward the floor so your lower back becomes rounded like the back of a cat. 3. Hold this position for 5 seconds. 4. Slowly lift your head, let your abdominal muscles relax and point your tailbone up toward the ceiling so your back forms a sagging arch like the back of a cow. 5. Hold this position for 5 seconds.  Press-ups Repeat these steps 5-10 times: 1. Lie on your abdomen (face-down) on the floor. 2. Place your palms near your head, about shoulder-width apart. 3. Keeping your back as relaxed as possible and keeping your hips on the floor, slowly straighten your arms to raise the top half of your body and lift your shoulders. Do not use your back muscles to raise your upper torso. You may adjust the placement of your hands to make yourself more comfortable. 4. Hold this position for 5 seconds while you keep your back relaxed. 5. Slowly return to lying flat on the floor.  Bridges Repeat these steps 10 times: 1. Lie on your back on a firm surface. 2. Bend your knees so they are pointing toward the ceiling and your feet are flat on the floor. Your arms should be flat at your sides, next to your body. 3. Tighten your buttocks muscles  and lift your buttocks off the floor until your waist is at almost the same height as your knees. You should feel the  muscles working in your buttocks and the back of your thighs. If you do not feel these muscles, slide your feet 1-2 inches farther away from your buttocks. 4. Hold this position for 3-5 seconds. 5. Slowly lower your hips to the starting position, and allow your buttocks muscles to relax completely. If this exercise is too easy, try doing it with your arms crossed over your chest. Abdominal crunches Repeat these steps 5-10 times: 1. Lie on your back on a firm bed or the floor with your legs extended. 2. Bend your knees so they are pointing toward the ceiling and your feet are flat on the floor. 3. Cross your arms over your chest. 4. Tip your chin slightly toward your chest without bending your neck. 5. Tighten your abdominal muscles and slowly raise your trunk (torso) high enough to lift your shoulder blades a tiny bit off the floor. Avoid raising your torso higher than that because it can put too much stress on your low back and does not help to strengthen your abdominal muscles. 6. Slowly return to your starting position. Back lifts Repeat these steps 5-10 times: 1. Lie on your abdomen (face-down) with your arms at your sides, and rest your forehead on the floor. 2. Tighten the muscles in your legs and your buttocks. 3. Slowly lift your chest off the floor while you keep your hips pressed to the floor. Keep the back of your head in line with the curve in your back. Your eyes should be looking at the floor. 4. Hold this position for 3-5 seconds. 5. Slowly return to your starting position. Contact a health care provider if:  Your back pain or discomfort gets much worse when you do an exercise.  Your worsening back pain or discomfort does not lessen within 2 hours after you exercise. If you have any of these problems, stop doing these exercises right away. Do not do them again unless your health care provider says that you can. Get help right away if:  You develop sudden, severe back pain.  If this happens, stop doing the exercises right away. Do not do them again unless your health care provider says that you can. This information is not intended to replace advice given to you by your health care provider. Make sure you discuss any questions you have with your health care provider. Document Released: 10/06/2004 Document Revised: 01/03/2019 Document Reviewed: 05/31/2018 Elsevier Patient Education  2020 [employer].

## 2019-07-09 LAB — CBC WITH DIFFERENTIAL/PLATELET
Absolute Monocytes: 431 cells/uL (ref 200–950)
Basophils Absolute: 28 cells/uL (ref 0–200)
Basophils Relative: 0.5 %
Eosinophils Absolute: 73 cells/uL (ref 15–500)
Eosinophils Relative: 1.3 %
HCT: 43.9 % (ref 35.0–45.0)
Hemoglobin: 14.9 g/dL (ref 11.7–15.5)
Lymphs Abs: 2229 cells/uL (ref 850–3900)
MCH: 30.2 pg (ref 27.0–33.0)
MCHC: 33.9 g/dL (ref 32.0–36.0)
MCV: 88.9 fL (ref 80.0–100.0)
MPV: 9.9 fL (ref 7.5–12.5)
Monocytes Relative: 7.7 %
Neutro Abs: 2839 cells/uL (ref 1500–7800)
Neutrophils Relative %: 50.7 %
Platelets: 258 10*3/uL (ref 140–400)
RBC: 4.94 10*6/uL (ref 3.80–5.10)
RDW: 11.8 % (ref 11.0–15.0)
Total Lymphocyte: 39.8 %
WBC: 5.6 10*3/uL (ref 3.8–10.8)

## 2019-07-09 LAB — COMPLETE METABOLIC PANEL WITH GFR
AG Ratio: 1.3 (calc) (ref 1.0–2.5)
ALT: 89 U/L — ABNORMAL HIGH (ref 6–29)
AST: 78 U/L — ABNORMAL HIGH (ref 10–30)
Albumin: 4.4 g/dL (ref 3.6–5.1)
Alkaline phosphatase (APISO): 34 U/L (ref 31–125)
BUN: 9 mg/dL (ref 7–25)
CO2: 22 mmol/L (ref 20–32)
Calcium: 9.7 mg/dL (ref 8.6–10.2)
Chloride: 105 mmol/L (ref 98–110)
Creat: 0.71 mg/dL (ref 0.50–1.10)
GFR, Est African American: 138 mL/min/{1.73_m2} (ref >=60)
GFR, Est Non African American: 119 mL/min/{1.73_m2} (ref >=60)
Globulin: 3.4 g/dL (calc) (ref 1.9–3.7)
Glucose, Bld: 75 mg/dL (ref 65–139)
Potassium: 4.2 mmol/L (ref 3.5–5.3)
Sodium: 139 mmol/L (ref 135–146)
Total Bilirubin: 0.4 mg/dL (ref 0.2–1.2)
Total Protein: 7.8 g/dL (ref 6.1–8.1)

## 2019-07-09 NOTE — Progress Notes (Signed)
LFTs are better but not normal.  Please notify patient and forward a copy to Dr. Collene Mares

## 2019-07-11 ENCOUNTER — Telehealth: Payer: Self-pay | Admitting: Rheumatology

## 2019-07-11 NOTE — Telephone Encounter (Signed)
Patient left a voicemail stating she was returning a call to the office.  Patient states she works 9-5 so might not be able to answer her phone.  Patient states she will call back during her lunch break.

## 2019-07-11 NOTE — Telephone Encounter (Signed)
Patient advised LFTs are better but not normal.

## 2019-07-23 ENCOUNTER — Other Ambulatory Visit (INDEPENDENT_AMBULATORY_CARE_PROVIDER_SITE_OTHER): Payer: Managed Care, Other (non HMO)

## 2019-07-23 ENCOUNTER — Other Ambulatory Visit: Payer: Self-pay

## 2019-07-23 DIAGNOSIS — E038 Other specified hypothyroidism: Secondary | ICD-10-CM

## 2019-07-23 DIAGNOSIS — E063 Autoimmune thyroiditis: Secondary | ICD-10-CM | POA: Diagnosis not present

## 2019-07-23 LAB — T4, FREE: Free T4: 0.82 ng/dL (ref 0.60–1.60)

## 2019-07-23 LAB — TSH: TSH: 6.97 u[IU]/mL — ABNORMAL HIGH (ref 0.35–4.50)

## 2019-07-26 ENCOUNTER — Other Ambulatory Visit: Payer: Self-pay | Admitting: Internal Medicine

## 2019-07-29 ENCOUNTER — Encounter: Payer: Self-pay | Admitting: Internal Medicine

## 2019-07-29 ENCOUNTER — Other Ambulatory Visit: Payer: Self-pay | Admitting: Internal Medicine

## 2019-07-29 DIAGNOSIS — E038 Other specified hypothyroidism: Secondary | ICD-10-CM

## 2019-07-29 DIAGNOSIS — E063 Autoimmune thyroiditis: Secondary | ICD-10-CM

## 2019-07-29 DIAGNOSIS — E039 Hypothyroidism, unspecified: Secondary | ICD-10-CM

## 2019-07-29 MED ORDER — LEVOTHYROXINE SODIUM 50 MCG PO TABS: ORAL_TABLET | ORAL | 3 refills | Status: DC

## 2019-08-13 DIAGNOSIS — L659 Nonscarring hair loss, unspecified: Secondary | ICD-10-CM | POA: Insufficient documentation

## 2019-08-27 ENCOUNTER — Other Ambulatory Visit: Payer: Self-pay | Admitting: Rheumatology

## 2019-08-27 ENCOUNTER — Telehealth: Payer: Self-pay | Admitting: Rheumatology

## 2019-08-27 DIAGNOSIS — R768 Other specified abnormal immunological findings in serum: Secondary | ICD-10-CM

## 2019-08-27 DIAGNOSIS — Z79899 Other long term (current) drug therapy: Secondary | ICD-10-CM

## 2019-08-27 MED ORDER — HYDROXYCHLOROQUINE SULFATE 200 MG PO TABS: 200.0000 mg | ORAL_TABLET | Freq: Two times a day (BID) | ORAL | 0 refills | Status: DC

## 2019-08-27 NOTE — Telephone Encounter (Signed)
Advised patient labs were received on 07/08/2019 which were the labs 1 month after starting PLQ. Advised patient she needs labs 3 month after starting PLQ and patient had labs drawn today. Plaquenil refill was sent to the pharmacy. Patient verbalized understanding.

## 2019-08-27 NOTE — Telephone Encounter (Signed)
Patient left a message stating she received message about needing more labs before she can get a refill on her generic Plaquenil. Patient states she will go to [facility] today to get labs, but she had labs draw at [facility] on [address] on 07/08/2019. Patient wanted to make sure we received those results. Please call to advise.

## 2019-08-27 NOTE — Telephone Encounter (Signed)
Last Visit: 07/08/2019 Next Visit: 09/17/2019 Labs: 07/08/2019 LFTs are better but not normal.  Eye exam: 06/25/2019  Advised patient we do not provide refills on 90 day supplies. Advised patient she is due to update labs as she has been on PLQ for 3 months. Patient verbalized understanding and will [facility] to update. Orders have been released.   Okay to refill per Dr. Estanislado Pandy.

## 2019-08-28 LAB — COMPLETE METABOLIC PANEL WITH GFR
AG Ratio: 1.2 (calc) (ref 1.0–2.5)
ALT: 62 U/L — ABNORMAL HIGH (ref 6–29)
AST: 83 U/L — ABNORMAL HIGH (ref 10–30)
Albumin: 4.1 g/dL (ref 3.6–5.1)
Alkaline phosphatase (APISO): 29 U/L — ABNORMAL LOW (ref 31–125)
BUN: 7 mg/dL (ref 7–25)
CO2: 22 mmol/L (ref 20–32)
Calcium: 9.4 mg/dL (ref 8.6–10.2)
Chloride: 105 mmol/L (ref 98–110)
Creat: 0.71 mg/dL (ref 0.50–1.10)
GFR, Est African American: 138 mL/min/{1.73_m2} (ref >=60)
GFR, Est Non African American: 119 mL/min/{1.73_m2} (ref >=60)
Globulin: 3.3 g/dL (calc) (ref 1.9–3.7)
Glucose, Bld: 131 mg/dL (ref 65–139)
Potassium: 3.9 mmol/L (ref 3.5–5.3)
Sodium: 138 mmol/L (ref 135–146)
Total Bilirubin: 0.4 mg/dL (ref 0.2–1.2)
Total Protein: 7.4 g/dL (ref 6.1–8.1)

## 2019-08-28 LAB — CBC WITH DIFFERENTIAL/PLATELET
Absolute Monocytes: 359 cells/uL (ref 200–950)
Basophils Absolute: 17 cells/uL (ref 0–200)
Basophils Relative: 0.3 %
Eosinophils Absolute: 51 cells/uL (ref 15–500)
Eosinophils Relative: 0.9 %
HCT: 44 % (ref 35.0–45.0)
Hemoglobin: 15 g/dL (ref 11.7–15.5)
Lymphs Abs: 1830 cells/uL (ref 850–3900)
MCH: 29.5 pg (ref 27.0–33.0)
MCHC: 34.1 g/dL (ref 32.0–36.0)
MCV: 86.4 fL (ref 80.0–100.0)
MPV: 9.6 fL (ref 7.5–12.5)
Monocytes Relative: 6.3 %
Neutro Abs: 3443 cells/uL (ref 1500–7800)
Neutrophils Relative %: 60.4 %
Platelets: 264 10*3/uL (ref 140–400)
RBC: 5.09 10*6/uL (ref 3.80–5.10)
RDW: 11.7 % (ref 11.0–15.0)
Total Lymphocyte: 32.1 %
WBC: 5.7 10*3/uL (ref 3.8–10.8)

## 2019-08-28 NOTE — Telephone Encounter (Signed)
Labs are stable.  Her LFTs are still elevated.  Please notify patient and fax results to Dr. Collene Mares

## 2019-09-16 ENCOUNTER — Other Ambulatory Visit: Payer: Self-pay

## 2019-09-16 ENCOUNTER — Other Ambulatory Visit (INDEPENDENT_AMBULATORY_CARE_PROVIDER_SITE_OTHER): Payer: Managed Care, Other (non HMO)

## 2019-09-16 DIAGNOSIS — E063 Autoimmune thyroiditis: Secondary | ICD-10-CM

## 2019-09-16 DIAGNOSIS — E038 Other specified hypothyroidism: Secondary | ICD-10-CM | POA: Diagnosis not present

## 2019-09-16 LAB — T4, FREE: Free T4: 0.91 ng/dL (ref 0.60–1.60)

## 2019-09-16 LAB — TSH: TSH: 4.19 u[IU]/mL (ref 0.35–4.50)

## 2019-09-16 NOTE — Progress Notes (Signed)
Virtual Visit via Video Note  I connected with Ariana Scott on 09/16/19 at 11:15 AM EST by a video enabled telemedicine application and verified that I am speaking with the correct person using two identifiers.  Location: Patient: Home  Provider: Clinic  This service was conducted via virtual visit.  Both audio and visual tools were used.  The patient was located at home. I was located in my office.  Consent was obtained prior to the virtual visit and is aware of possible charges through their insurance for this visit.  The patient is an established patient.  Dr. Estanislado Pandy, MD conducted the virtual visit and Ariana Sams, PA-C acted as scribe during the service.  Office staff helped with scheduling follow up visits after the service was conducted.   I discussed the limitations of evaluation and management by telemedicine and the availability of in person appointments. The patient expressed understanding and agreed to proceed.  CC: Facial rash and acne  History of Present Illness: Ariana Scott is a 24 y.o. female with history of autoimmune disease.  She is taking plaquenil 200 mg 1 tablet by mouth twice daily (started in September 2020). She has not noticed any improvement in her facial rash.  She has been experiencing increased acne and redness related to skin dryness. She uses aveeno topically as needed. She denies any sores in mouth or nose.  She has not had any recent sicca symptoms.  She states her lower back pain has improved since starting on PLQ.  She denies any other joint pain or joint swelling recently.   She has a persistent elevation in LFTs.  She reports she has not seen Dr. Collene Mares recently.   Review of Systems  Constitutional: Positive for malaise/fatigue. Negative for fever.  HENT: Negative for congestion.   Eyes: Negative for photophobia, pain, discharge and redness.  Respiratory: Negative for cough, shortness of breath and wheezing.   Cardiovascular: Negative for chest pain,  palpitations and leg swelling.  Gastrointestinal: Negative for blood in stool, constipation and diarrhea.  Genitourinary: Negative for dysuria and urgency.  Musculoskeletal: Positive for back pain and joint pain. Negative for myalgias and neck pain.  Skin: Positive for rash.  Neurological: Negative for dizziness, weakness and headaches.  Endo/Heme/Allergies: Does not bruise/bleed easily.  Psychiatric/Behavioral: Negative for depression and memory loss. The patient is not nervous/anxious and does not have insomnia.      Observations/Objective: Physical Exam  Constitutional: She is oriented to person, place, and time and well-developed, well-nourished, and in no distress.  HENT:  Head: Normocephalic and atraumatic.  Eyes: Conjunctivae are normal.  Pulmonary/Chest: Effort normal.  Neurological: She is alert and oriented to person, place, and time.  Psychiatric: Mood, memory, affect and judgment normal.   Patient reports morning stiffness for 2 hours.   Patient denies nocturnal pain.  Difficulty dressing/grooming: Denies Difficulty climbing stairs: Denies Difficulty getting out of chair: Denies Difficulty using hands for taps, buttons, cutlery, and/or writing: Denies   Assessment and Plan: Visit Diagnoses: Autoimmune disease (JAARS) - Positive ANA, double-stranded DNA, positive Ro, history of photosensitivity, facial rash and elevated LFTs: She has not had any signs or symptoms of a flare recently.  She is clinically doing well on Plaquenil 200 mg 1 tablet by mouth twice daily.  She has not missed any doses recently.  She has a persistent facial rash and photosensitivity.  She has noticed worsening facial acne recently.  She will be referred to Dr. Sharol Roussel for further evaluation of her  rash.  She has been using aveeno eczema therapy topically as needed.  She has not had any oral/nasal ulcerations or sicca symptoms recently. She has not had any symptoms of Raynaud's.  She has no joint  inflammation at this time.  She has a persistent elevation in LFTs, and it is uncertain if the elevation in LFTs is related to fatty liver or autoimmune disease.  She was advised to follow up with Dr. Loreta Ave for further evaluation. She will continue taking plaquenil as prescribed.  She will be due to update autoimmune lab work in March. Orders will be placed today.  She was advised to notify us if she develops increased joint pain or joint swelling.  She will follow up in 3-4 months.   High risk medication use - Plaquenil 200 mg 1 tablet twice daily started in September 2020. PLQ eye exam: 06/25/2019. CBC and CMP were drawn on 08/27/19.  She will return for lab work in March.   Elevated LFTs-Fatty liver vs. Autoimmune disease: AST 83 and ALT 62 (stable) on 08/27/19.  She was advised to follow up with Dr. Loreta Ave.   Fatty liver  Neck pain - X-ray of the cervical spine was unremarkable.  She has intermittent neck pain and stiffness.    Chronic midline low back pain without sciatica -  X-ray of the lumbar spine was unremarkable.  Mild scoliosis was noted.  Her lower back pain has improved.  She is not having any symptoms of sciatica at this time.   Other fatigue-Her level of fatigue has improved since increasing the dose of levothyroxine.    Other medical problems are listed as follows:  Lichen planus  History of hypothyroidism  Anxiety  Other acne  Follow Up Instructions: She will follow up in 3-4 months.    I discussed the assessment and treatment plan with the patient. The patient was provided an opportunity to ask questions and all were answered. The patient agreed with the plan and demonstrated an understanding of the instructions.   The patient was advised to call back or seek an in-person evaluation if the symptoms worsen or if the condition fails to improve as anticipated.  I provided 25 minutes of non-face-to-face time during this encounter.  Pollyann Savoy, MD    Scribed by-  Sherron Ales, PA-C

## 2019-09-17 ENCOUNTER — Other Ambulatory Visit: Payer: Self-pay | Admitting: *Deleted

## 2019-09-17 ENCOUNTER — Encounter: Payer: Self-pay | Admitting: Rheumatology

## 2019-09-17 ENCOUNTER — Telehealth (INDEPENDENT_AMBULATORY_CARE_PROVIDER_SITE_OTHER): Payer: Managed Care, Other (non HMO) | Admitting: Rheumatology

## 2019-09-17 DIAGNOSIS — Z79899 Other long term (current) drug therapy: Secondary | ICD-10-CM | POA: Diagnosis not present

## 2019-09-17 DIAGNOSIS — R7989 Other specified abnormal findings of blood chemistry: Secondary | ICD-10-CM

## 2019-09-17 DIAGNOSIS — L708 Other acne: Secondary | ICD-10-CM

## 2019-09-17 DIAGNOSIS — K760 Fatty (change of) liver, not elsewhere classified: Secondary | ICD-10-CM | POA: Diagnosis not present

## 2019-09-17 DIAGNOSIS — L439 Lichen planus, unspecified: Secondary | ICD-10-CM

## 2019-09-17 DIAGNOSIS — F419 Anxiety disorder, unspecified: Secondary | ICD-10-CM

## 2019-09-17 DIAGNOSIS — M542 Cervicalgia: Secondary | ICD-10-CM

## 2019-09-17 DIAGNOSIS — R5383 Other fatigue: Secondary | ICD-10-CM

## 2019-09-17 DIAGNOSIS — R748 Abnormal levels of other serum enzymes: Secondary | ICD-10-CM

## 2019-09-17 DIAGNOSIS — R21 Rash and other nonspecific skin eruption: Secondary | ICD-10-CM

## 2019-09-17 DIAGNOSIS — M359 Systemic involvement of connective tissue, unspecified: Secondary | ICD-10-CM | POA: Diagnosis not present

## 2019-09-17 DIAGNOSIS — G8929 Other chronic pain: Secondary | ICD-10-CM

## 2019-09-17 DIAGNOSIS — Z8639 Personal history of other endocrine, nutritional and metabolic disease: Secondary | ICD-10-CM

## 2019-09-17 DIAGNOSIS — M545 Low back pain, unspecified: Secondary | ICD-10-CM

## 2019-10-02 ENCOUNTER — Telehealth: Payer: Self-pay | Admitting: Rheumatology

## 2019-10-02 NOTE — Telephone Encounter (Signed)
I lmom for patient to call, and schedule follow up appointment for around 01/14/2020.

## 2019-10-02 NOTE — Telephone Encounter (Signed)
-----   Message from Audrie Lia, RT sent at 09/17/2019  4:40 PM EST ----- Regarding: 3-4 MONTH F/U

## 2019-10-04 ENCOUNTER — Ambulatory Visit: Payer: Managed Care, Other (non HMO) | Admitting: Internal Medicine

## 2019-10-08 ENCOUNTER — Other Ambulatory Visit: Payer: Self-pay

## 2019-10-08 ENCOUNTER — Ambulatory Visit (INDEPENDENT_AMBULATORY_CARE_PROVIDER_SITE_OTHER): Payer: Managed Care, Other (non HMO) | Admitting: Internal Medicine

## 2019-10-08 ENCOUNTER — Encounter: Payer: Self-pay | Admitting: Internal Medicine

## 2019-10-08 DIAGNOSIS — L659 Nonscarring hair loss, unspecified: Secondary | ICD-10-CM | POA: Diagnosis not present

## 2019-10-08 DIAGNOSIS — E063 Autoimmune thyroiditis: Secondary | ICD-10-CM

## 2019-10-08 DIAGNOSIS — R002 Palpitations: Secondary | ICD-10-CM

## 2019-10-08 DIAGNOSIS — E038 Other specified hypothyroidism: Secondary | ICD-10-CM

## 2019-10-08 MED ORDER — LEVOTHYROXINE SODIUM 50 MCG PO TABS: ORAL_TABLET | ORAL | 3 refills | Status: DC

## 2019-10-08 NOTE — Progress Notes (Signed)
Patient ID: Ariana Scott, female   DOB: 02-Mar-1996, 24 y.o.   MRN: [id number]   Patient location: Home My location: Office Persons participating in the virtual visit: patient, provider  I connected with the patient on 10/08/19 at  9:16 AM EST by a video enabled telemedicine application and verified that I am speaking with the correct person.   I discussed the limitations of evaluation and management by telemedicine and the availability of in person appointments. The patient expressed understanding and agreed to proceed.   Details of the encounter are shown below.  HPI  Ariana Scott is a 24 y.o.-year-old female, initially referred by Dr. Collene Mares, returning for follow-up for Hashimoto's hypothyroidism.  Last visit 6 months ago.  Reviewed and addended patient's history: Pt. has been dx with hypothyroidism  in 11/2018  during investigation for IBS by Dr. Collene Mares.  Patient has had constipation for years, but she then started to have loose stools (for 4 years) and was sent to GI.  Dr. Collene Mares checked her TSH which returned very high, at 42.35.   She was started on levothyroxine 75 mcg daily and referred to endocrinology.  At last visit, she was taking 52 to 59 mcg levothyroxine daily, but was having palpitations so we switched to 50 mcg tablets, which are white.  She tolerates these well.  Pt is on levothyroxine 100 mcg daily, taken: - in am - fasting - at least 45 min from b'fast - no Ca, Fe, MVI, PPIs - not on Biotin  I reviewed patient's TFTs: Lab Results  Component Value Date   TSH 4.19 09/16/2019   TSH 6.97 (H) 07/23/2019   TSH 5.72 (H) 06/18/2019   TSH 85.26 (H) 04/01/2019   TSH 1.38 01/24/2019   FREET4 0.91 09/16/2019   FREET4 0.82 07/23/2019   FREET4 0.82 06/18/2019   FREET4 0.46 (L) 04/01/2019   FREET4 0.97 01/24/2019  12/11/2018: TSH 42.35  Her antithyroid antibodies are positive, giving her a diagnosis of Hashimoto's thyroiditis: Component     Latest Ref Rng & Units  01/24/2019  Thyroperoxidase Ab SerPl-aCnc     <9 IU/mL 395 (H)  Thyroglobulin Ab     < or = 1 IU/mL 2 (H)   At last visit, she had palpitations, which improved after changing levothyroxine tablets to white ones (50 mcg).  Pt denies: - feeling nodules in neck - hoarseness - dysphagia - choking - SOB with lying down  She has + FH of thyroid disorders in: PGGF. No FH of thyroid cancer. No h/o radiation tx to head or neck.  No seaweed or kelp. No recent contrast studies. No herbal supplements. No Biotin use. No recent steroids use.   She also has a history of trochanteric bursitis.  She is on OCPs for irregular menses -started many years ago.  She also has PCOS.  She was also found to have elevated LFTs at recent investigation by Dr. Collene Mares.  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  She had a liver ultrasound since last visit and that showed fatty liver. She may need to have a liver Bx.  ROS: Constitutional: no weight gain/no weight loss, no fatigue, no subjective hyperthermia, no subjective hypothermia Eyes: no blurry vision, no xerophthalmia ENT: no sore throat, + see HPI Cardiovascular: no CP/no SOB/no palpitations/no leg swelling Respiratory: no cough/no SOB/no wheezing Gastrointestinal: no N/no V/no D/no C/no acid reflux Musculoskeletal: no muscle aches/+ joint aches Skin: no rashes, + more hair  loss - increased 2 mo ago Neurological: no tremors/no numbness/no tingling/no dizziness  I reviewed pt's medications, allergies, PMH, social hx, family hx, and changes were documented in the history of present illness. Otherwise, unchanged from my initial visit note. Past Medical History:  Diagnosis Date  . Polycystic ovarian syndrome    Past Surgical History:  Procedure Laterality Date  . COLONOSCOPY  2020   Dr. Loreta Ave  . UPPER GASTROINTESTINAL ENDOSCOPY  2020   Dr. Loreta Ave  . WISDOM TOOTH EXTRACTION  2016   Social History   Socioeconomic  History  . Marital status: Single    Spouse name: Not on file  . Number of children: 0  . Years of education: Not on file  . Highest education level: Not on file  Occupational History  .  Recent college graduate, unemployed  Social Needs  . Financial resource strain: Not on file  . Food insecurity:    Worry: Not on file    Inability: Not on file  . Transportation needs:    Medical: Not on file    Non-medical: Not on file  Tobacco Use  . Smoking status: Never Smoker  . Smokeless tobacco: Never Used  Substance and Sexual Activity  . Alcohol use:  1 glass of wine,  . Drug use: No   Current Outpatient Medications on File Prior to Visit  Medication Sig Dispense Refill  . clobetasol ointment (TEMOVATE) 0.05 % APPLY A THIN LAYER TOPICALLY TO THE AFFECTED AREA(S) 2X A DAY FOR 1WK, EVERY OTHER DAY 1WK, 2X A WK.    Marland Kitchen Colloidal Oatmeal (AVEENO BABY ECZEMA THERAPY EX) Apply topically daily.    . hydroxychloroquine (PLAQUENIL) 200 MG tablet Take 1 tablet (200 mg total) by mouth 2 (two) times daily. 180 tablet 0  . hydrOXYzine (ATARAX/VISTARIL) 10 MG tablet Take 10 mg by mouth as needed. 1-2 tablets as needed for anxiety    . levothyroxine (SYNTHROID) 50 MCG tablet TAKE 2 tablets by mouth daily 180 tablet 3  . mometasone (ELOCON) 0.1 % ointment Apply topically as needed. Thin layer applied to fingers every night     . tretinoin (RETIN-A) 0.05 % cream Apply topically at bedtime.    . VELIVET 0.1/0.125/0.15 -0.025 MG tablet Take 1 tablet by mouth daily.     No current facility-administered medications on file prior to visit.   Allergies  Allergen Reactions  . Kiwi Extract Itching  . Pineapple Itching   Family history: -Mother, aunt with diabetes -Father with HTN and HL -Cancer in paternal grandmother: Breast and uterus + See HPI  PE: There were no vitals taken for this visit. Wt Readings from Last 3 Encounters:  07/08/19 201 lb 12.8 oz (91.5 kg)  05/22/19 199 lb 6.4 oz (90.4 kg)   05/01/19 196 lb (88.9 kg)   Constitutional:  in NAD  The physical exam was not performed (virtual visit).  ASSESSMENT: 1. Hypothyroidism -Due to Hashimoto's thyroiditis  2.  Palpitations  3. Hair loss  PLAN:  1. Patient with history of acquired hypothyroidism, initially with symptoms consistent with hypothyroidism: Weight gain, dry skin, change in bowel habits, but also anxiety with tachycardia and tremors.  She takes antianxiety medications prn.  She was started on levothyroxine and she felt better afterwards, with less fatigue.  She initially had headaches but these resolved.  - latest thyroid labs reviewed with pt >> normal earlier this month: Lab Results  Component Value Date   TSH 4.19 09/16/2019   - she continues on LT4  100 mcg daily (2 tablets of 50 mcg daily)-dose increased 07/2019. - pt feels good on this dose. - we discussed about taking the thyroid hormone every day, with water, >30 minutes before breakfast, separated by >4 hours from acid reflux medications, calcium, iron, multivitamins. Pt. is taking it correctly. - will check thyroid tests in 2 months -I will see her back in 6 months  2.  Palpitations -These have now resolved -Possibly related to her previous levothyroxine formulation.  We changed to 50 mcg tablets, which are white and she tolerates this better.  She actually tolerates 2 tablets a day currently.  TFTs have been normal 3 weeks ago.  3. Hair loss -Mentions that she has dry skin and also dry hair, which does not absorb moisture -Discussed to start a flax/fish oil supplement -She has an appointment with dermatology coming up  Orders Placed This Encounter  Procedures  . TSH  . T4, free   Carlus Pavlov, MD PhD [facility]

## 2019-10-08 NOTE — Patient Instructions (Signed)
Please continue: - Levothyroxine 100 mcg every day  Take the thyroid hormone every day, with water, at least 30 minutes before breakfast, separated by at least 4 hours from: - acid reflux medications - calcium - iron - multivitamins  Please come back for labs in 2 months.  Please come back for a follow-up appointment in 6 months.

## 2019-11-11 ENCOUNTER — Other Ambulatory Visit (INDEPENDENT_AMBULATORY_CARE_PROVIDER_SITE_OTHER): Payer: Managed Care, Other (non HMO)

## 2019-11-11 ENCOUNTER — Other Ambulatory Visit: Payer: Self-pay

## 2019-11-11 DIAGNOSIS — E063 Autoimmune thyroiditis: Secondary | ICD-10-CM | POA: Diagnosis not present

## 2019-11-11 DIAGNOSIS — E038 Other specified hypothyroidism: Secondary | ICD-10-CM

## 2019-11-11 LAB — T4, FREE: Free T4: 0.91 ng/dL (ref 0.60–1.60)

## 2019-11-11 LAB — TSH: TSH: 2.58 u[IU]/mL (ref 0.35–4.50)

## 2019-11-20 ENCOUNTER — Other Ambulatory Visit: Payer: Self-pay | Admitting: Rheumatology

## 2019-11-20 DIAGNOSIS — R768 Other specified abnormal immunological findings in serum: Secondary | ICD-10-CM

## 2019-11-20 NOTE — Telephone Encounter (Signed)
Last Visit: 07/08/2019 Next Visit: 09/17/2019 Labs: 08/27/19  Labs are stable. Her LFTs are still elevated.  Eye exam: 06/25/2019  Okay to refill per Dr. Corliss Skains

## 2019-11-25 ENCOUNTER — Other Ambulatory Visit: Payer: Self-pay | Admitting: *Deleted

## 2019-11-25 DIAGNOSIS — R7989 Other specified abnormal findings of blood chemistry: Secondary | ICD-10-CM

## 2019-11-25 DIAGNOSIS — M359 Systemic involvement of connective tissue, unspecified: Secondary | ICD-10-CM

## 2019-11-25 DIAGNOSIS — R748 Abnormal levels of other serum enzymes: Secondary | ICD-10-CM

## 2019-11-25 DIAGNOSIS — Z79899 Other long term (current) drug therapy: Secondary | ICD-10-CM

## 2019-11-27 LAB — C3 AND C4
C3 Complement: 198 mg/dL — ABNORMAL HIGH (ref 83–193)
C4 Complement: 36 mg/dL (ref 15–57)

## 2019-11-27 LAB — COMPLETE METABOLIC PANEL WITH GFR
AG Ratio: 1.3 (calc) (ref 1.0–2.5)
ALT: 66 U/L — ABNORMAL HIGH (ref 6–29)
AST: 68 U/L — ABNORMAL HIGH (ref 10–30)
Albumin: 4.6 g/dL (ref 3.6–5.1)
Alkaline phosphatase (APISO): 40 U/L (ref 31–125)
BUN: 8 mg/dL (ref 7–25)
CO2: 22 mmol/L (ref 20–32)
Calcium: 10 mg/dL (ref 8.6–10.2)
Chloride: 103 mmol/L (ref 98–110)
Creat: 0.78 mg/dL (ref 0.50–1.10)
GFR, Est African American: 123 mL/min/{1.73_m2} (ref >=60)
GFR, Est Non African American: 106 mL/min/{1.73_m2} (ref >=60)
Globulin: 3.6 g/dL (calc) (ref 1.9–3.7)
Glucose, Bld: 119 mg/dL — ABNORMAL HIGH (ref 65–99)
Potassium: 3.8 mmol/L (ref 3.5–5.3)
Sodium: 137 mmol/L (ref 135–146)
Total Bilirubin: 0.4 mg/dL (ref 0.2–1.2)
Total Protein: 8.2 g/dL — ABNORMAL HIGH (ref 6.1–8.1)

## 2019-11-27 LAB — CBC WITH DIFFERENTIAL/PLATELET
Absolute Monocytes: 329 cells/uL (ref 200–950)
Basophils Absolute: 22 cells/uL (ref 0–200)
Basophils Relative: 0.4 %
Eosinophils Absolute: 81 cells/uL (ref 15–500)
Eosinophils Relative: 1.5 %
HCT: 45 % (ref 35.0–45.0)
Hemoglobin: 15.3 g/dL (ref 11.7–15.5)
Lymphs Abs: 1825 cells/uL (ref 850–3900)
MCH: 29.8 pg (ref 27.0–33.0)
MCHC: 34 g/dL (ref 32.0–36.0)
MCV: 87.5 fL (ref 80.0–100.0)
MPV: 10.6 fL (ref 7.5–12.5)
Monocytes Relative: 6.1 %
Neutro Abs: 3143 cells/uL (ref 1500–7800)
Neutrophils Relative %: 58.2 %
Platelets: 185 10*3/uL (ref 140–400)
RBC: 5.14 10*6/uL — ABNORMAL HIGH (ref 3.80–5.10)
RDW: 12.3 % (ref 11.0–15.0)
Total Lymphocyte: 33.8 %
WBC: 5.4 10*3/uL (ref 3.8–10.8)

## 2019-11-27 LAB — ANTI-DNA ANTIBODY, DOUBLE-STRANDED: ds DNA Ab: 30 IU/mL — ABNORMAL HIGH

## 2019-11-27 LAB — URINALYSIS, ROUTINE W REFLEX MICROSCOPIC
Bilirubin Urine: NEGATIVE
Glucose, UA: NEGATIVE
Hgb urine dipstick: NEGATIVE
Ketones, ur: NEGATIVE
Leukocytes,Ua: NEGATIVE
Nitrite: NEGATIVE
Protein, ur: NEGATIVE
Specific Gravity, Urine: 1.019 (ref 1.001–1.03)
pH: 5.5 (ref 5.0–8.0)

## 2019-11-27 LAB — CARDIOLIPIN ANTIBODIES, IGG, IGM, IGA
Anticardiolipin IgA: <11 [APL'U]
Anticardiolipin IgG: <14 [GPL'U]
Anticardiolipin IgM: <12 [MPL'U]

## 2019-11-27 LAB — SEDIMENTATION RATE: Sed Rate: 9 mm/h (ref 0–20)

## 2019-11-27 LAB — SJOGRENS SYNDROME-A EXTRACTABLE NUCLEAR ANTIBODY: SSA (Ro) (ENA) Antibody, IgG: >8 AI — AB

## 2019-11-27 LAB — CK: Total CK: 98 U/L (ref 29–143)

## 2019-11-27 NOTE — Progress Notes (Signed)
LFTs remain elevated but stable. Please notify patient and forward labs to her GI specialist.    Ro antibody remains positive.  Anticardiolipin antibodies are negative.  ESR WNL.  DsDNA is positive but stable. CK WNL.  CBC stable. C3 is mildly elevated and C4 is WNL-not consistent with a flare. UA WNL.

## 2019-11-28 ENCOUNTER — Other Ambulatory Visit: Payer: Self-pay | Admitting: Rheumatology

## 2019-11-28 DIAGNOSIS — R768 Other specified abnormal immunological findings in serum: Secondary | ICD-10-CM

## 2019-12-03 ENCOUNTER — Encounter: Payer: Self-pay | Admitting: Rheumatology

## 2019-12-07 ENCOUNTER — Ambulatory Visit: Payer: Managed Care, Other (non HMO) | Attending: Internal Medicine

## 2019-12-07 DIAGNOSIS — Z23 Encounter for immunization: Secondary | ICD-10-CM

## 2019-12-07 NOTE — Progress Notes (Signed)
   Covid-19 Vaccination Clinic  Name:  Ariana Scott    MRN: [id number] DOB: 1995/12/19  12/07/2019  Ariana Scott was observed post Covid-19 immunization for 15 minutes without incident. She was provided with Vaccine Information Sheet and instruction to access the V-Safe system.   Ariana Scott was instructed to call 911 with any severe reactions post vaccine: Marland Kitchen Difficulty breathing  . Swelling of face and throat  . A fast heartbeat  . A bad rash all over body  . Dizziness and weakness   Immunizations Administered    Name Date Dose VIS Date Route   Pfizer COVID-19 Vaccine 12/07/2019  1:13 PM 0.3 mL 08/23/2019 Intramuscular   Manufacturer: [employer], [employer]   Lot: [id number]   NDC: [address]-5

## 2019-12-09 ENCOUNTER — Other Ambulatory Visit: Payer: Managed Care, Other (non HMO)

## 2019-12-17 ENCOUNTER — Telehealth: Payer: Self-pay | Admitting: Rheumatology

## 2019-12-17 NOTE — Telephone Encounter (Signed)
I received a phone call from Dr. Loreta Ave.  She saw Ariana Scott today.  She states the patient was not sure what is her current diagnosis.  I called patient back.  I had detailed discussion with [place]. I explained to her that based on her symptoms her labs she does not meet the full criteria for lupus.  She has positive ANA, positive double-stranded DNA, positive Ro, photosensitivity, facial rash, elevated LFTs, fatigue, sicca symptoms based on this information she was given a diagnosis of autoimmune disease.  Patient voiced understanding. Pollyann Savoy, MD

## 2019-12-19 ENCOUNTER — Encounter: Payer: Self-pay | Admitting: Rheumatology

## 2019-12-19 NOTE — Telephone Encounter (Signed)
Please schedule an in-office appointment for further evaluation.

## 2019-12-19 NOTE — Telephone Encounter (Signed)
Attempted to contact the patient and left message for patient to call the office.  

## 2019-12-19 NOTE — Progress Notes (Signed)
Office Visit Note  Patient: Ariana Scott             Date of Birth: 07/15/1996           MRN: [id number]             PCP: Mancel Bale, PA-C Referring: Mancel Bale, PA-C Visit Date: 12/20/2019 Occupation: @GUAROCC @  Subjective:  Left foot pain   History of Present Illness: TOLEEN LACHAPELLE is a 24 y.o. female with history of autoimmune disease.  Patient is taking Plaquenil 200 mg 1 tablet by mouth twice daily.  She is been tolerating Plaquenil without any side effects.  Overall she has noted significant improvement in her symptoms since starting on Plaquenil.  She states that her lower back and shoulder joint discomfort has improved.  She continues to have rosacea on her face and was evaluated by dermatologist and was provided topical agents.  She also has eczema on both hands and has been using clobetasol topically.  She denies any new rashes.  She denies any sicca symptoms.  She denies any sores in her mouth or nose.  She is not had any enlarged lymph nodes recently.  She denies any symptoms of Raynaud's.  She denies any chest pain or shortness of breath. Patient reports that yesterday morning she woke up with pain and numbness along the pad of her left foot worse near the first MTP joint.  She states that she was experiencing an itching and stinging sensation on the plantar aspect under the great toe.  She states that she was having difficulty curling her toes and felt that there was some inflammation near the ball of her foot.  She states that yesterday she had to work and was on her feet all day and the symptoms improved as the day went on.  She states that she has never experienced any symptoms like this in the past.  She has not had any symptoms similar to this in her right foot.  She denies changing shoes recently.  She has started exercising more this past week due to needing to try to lose weight.  She continues to follow-up with Dr. Collene Mares for elevated LFTs.  According to the patient  she was advised to try to lose as much weight as she can within the next 3 months or she will have to proceed with a liver biopsy in the future.     Activities of Daily Living:  Patient reports morning stiffness for 0 none.   Patient Reports nocturnal pain.  Difficulty dressing/grooming: Denies Difficulty climbing stairs: Denies Difficulty getting out of chair: Denies Difficulty using hands for taps, buttons, cutlery, and/or writing: Denies  Review of Systems  Constitutional: Negative for fatigue.  HENT: Negative for mouth sores, mouth dryness and nose dryness.   Eyes: Negative for pain, visual disturbance and dryness.  Respiratory: Negative for cough, hemoptysis, shortness of breath and difficulty breathing.   Cardiovascular: Negative for chest pain, palpitations, hypertension and swelling in legs/feet.  Gastrointestinal: Negative for blood in stool, constipation and diarrhea.  Endocrine: Negative for excessive thirst and increased urination.  Genitourinary: Negative for difficulty urinating and painful urination.  Musculoskeletal: Positive for arthralgias, joint pain and muscle tenderness. Negative for joint swelling, myalgias, muscle weakness, morning stiffness and myalgias.  Skin: Positive for rash. Negative for color change, pallor, hair loss, nodules/bumps, skin tightness, ulcers and sensitivity to sunlight.  Allergic/Immunologic: Negative for susceptible to infections.  Neurological: Positive for numbness. Negative for dizziness,  headaches and weakness.  Hematological: Negative for bruising/bleeding tendency and swollen glands.  Psychiatric/Behavioral: Negative for depressed mood and sleep disturbance. The patient is not nervous/anxious.     PMFS History:  Patient Active Problem List   Diagnosis Date Noted  . Chronic midline low back pain without sciatica 07/08/2019  . Elevated LFTs 07/08/2019  . Fatty liver 07/08/2019  . Autoimmune disease ([place]) 07/08/2019  . Anxiety  05/01/2019  . Lichen planus 76/73/4193  . Other acne 05/01/2019  . Hypothyroidism (acquired) 01/24/2019    Past Medical History:  Diagnosis Date  . Autoimmune disease ([place])   . Eczema   . Erosive lichen planus of vulva   . Hashimoto's disease   . Hypothyroidism   . Polycystic ovarian syndrome     Family History  Problem Relation Age of Onset  . Diabetes Mother   . Depression Mother   . ADD / ADHD Mother   . Depression Father   . Vitamin D deficiency Father   . Colitis Father   . Hypertension Father   . High Cholesterol Father   . ADD / ADHD Brother    Past Surgical History:  Procedure Laterality Date  . COLONOSCOPY  2020   Dr. Collene Mares  . UPPER GASTROINTESTINAL ENDOSCOPY  2020   Dr. Collene Mares  . WISDOM TOOTH EXTRACTION  2016   Social History   Social History Narrative  . Not on file   Immunization History  Administered Date(s) Administered  . DTaP 10/16/1995, 11/29/1995, 01/11/1996, 01/10/1997, 04/28/2000  . HPV Quadrivalent 05/21/2007, 07/03/2008  . Hepatitis A 05/21/2007, 07/03/2008  . Hepatitis B 1994/10/28, 08/01/1995, 01/11/1996  . HiB (PRP-OMP) 10/16/1995, 11/29/1995, 01/11/1996, 01/10/1997  . IPV 10/16/1995, 11/29/1995, 01/11/1996, 04/28/2000  . MMR 07/02/1996, 04/28/2000  . Meningococcal Conjugate 05/21/2007  . PFIZER SARS-COV-2 Vaccination 12/07/2019  . Tdap 04/23/2007     Objective: Vital Signs: BP 105/71 (BP Location: Left Arm, Patient Position: Sitting, Cuff Size: Normal)   Pulse 75   Resp 14   Ht 5' 5"  (1.651 m)   Wt 210 lb 6.4 oz (95.4 kg)   BMI 35.01 kg/m    Physical Exam Vitals and nursing note reviewed.  Constitutional:      Appearance: She is well-developed.  HENT:     Head: Normocephalic and atraumatic.  Eyes:     Conjunctiva/sclera: Conjunctivae normal.  Pulmonary:     Effort: Pulmonary effort is normal.  Abdominal:     General: Bowel sounds are normal.     Palpations: Abdomen is soft.  Musculoskeletal:     Cervical back: Normal  range of motion.  Lymphadenopathy:     Cervical: No cervical adenopathy.  Skin:    General: Skin is warm and dry.     Capillary Refill: Capillary refill takes less than 2 seconds.     Comments: Erythema noted on the plantar aspect of both feet.   Eczema noted on the palmar aspect of both hands.   Neurological:     Mental Status: She is alert and oriented to person, place, and time.  Psychiatric:        Behavior: Behavior normal.      Musculoskeletal Exam: C-spine, thoracic spine, and lumbar spine good.  Shoulder joints, elbow joints, wrist joints, MCPs, PIPs, and DIPS good ROM with no synovitis. Complete fist formation bilaterally. Hip joints, knee joints, ankle joints, MTPs, PIPs, and DIPs good ROM with no synovitis. No warmth or effusion of knee joints.  No tenderness or swelling of ankle joints. Tenderness of  the left 1st MTP and PIP joint. No tenderness over trochanteric bursa bilaterally.    CDAI Exam: CDAI Score: - Patient Global: -; Provider Global: - Swollen: -; Tender: - Joint Exam 12/20/2019   No joint exam has been documented for this visit   There is currently no information documented on the homunculus. Go to the Rheumatology activity and complete the homunculus joint exam.  Investigation: No additional findings.  Imaging: No results found.  Recent Labs: Lab Results  Component Value Date   WBC 5.4 11/25/2019   HGB 15.3 11/25/2019   PLT 185 11/25/2019   NA 137 11/25/2019   K 3.8 11/25/2019   CL 103 11/25/2019   CO2 22 11/25/2019   GLUCOSE 119 (H) 11/25/2019   BUN 8 11/25/2019   CREATININE 0.78 11/25/2019   BILITOT 0.4 11/25/2019   AST 68 (H) 11/25/2019   ALT 66 (H) 11/25/2019   PROT 8.2 (H) 11/25/2019   CALCIUM 10.0 11/25/2019   GFRAA 123 11/25/2019    Speciality Comments: PLQ eye exam: 06/25/2019 normal. [facility]. Follow up in 6 months.  Procedures:  No procedures performed Allergies: Kiwi extract and Pineapple   Assessment / Plan:      Visit Diagnoses: Autoimmune disease ([place]) - Positive ANA, double-stranded DNA, positive Ro, history of photosensitivity, facial rash and elevated LFTs.  Patient is clinically doing much better since she has been on Plaquenil.  She has noticed improvement in her photosensitivity and a rash on.  Joint pain has improved.  High risk medication use - Plaquenil 200 mg 1 tablet twice daily started in September 2020. PLQ eye exam: 06/25/2019.  Her labs have been stable except for elevated LFTs.  Elevated LFTs - -Fatty liver vs. Autoimmune disease: Her AST and ALT are still in 60s.  She is trying weight loss and then will have repeat labs with Dr. Collene Mares.    Elevated CK - CK was 98 on 11/25/19  Rash-she continues to have some facial rash which is much improved.  She has been followed by dermatologist.  Photosensitivity is improved.  Chronic midline low back pain without sciatica-related to her work.  Paresthesia of left foot -patient states after long day of work she started having pain and discomfort in left great toe.  She was also have experiencing some burning and redness in her bilateral feet.  She states the stiffness and the numbness has improved now.  She was also having difficulty bending her toe which is resolved now.  She has mild tenderness over the left first PIP joint.  No synovitis was noted.  We had detailed discussion regarding proper fitting shoes and to wear padded socks.  I believe that she might have developed mild tendinitis being on her foot for a long time.  She is concerned about diabetic neuropathy as her parents have diabetic neuropathy.  The symptoms are unlikely for diabetic neuropathy.  Decrease carb intake was discussed.  I will also check hemoglobin A1c with her next labs.  Plan: HgB A1c  Fatty liver-patient will try intentional weight loss and exercise.  Other medical problems are listed as follows:  Other fatigue  Lichen planus  History of  hypothyroidism  Orders: Orders Placed This Encounter  Procedures  . HgB A1c   No orders of the defined types were placed in this encounter.   Face-to-face time spent with patient was 30 minutes. Greater than 50% of time was spent in counseling and coordination of care.  Follow-Up Instructions: Return in about  3 months (around 03/20/2020) for Autoimmune disease.   Bo Merino, MD  Note - This record has been created using Editor, commissioning.  Chart creation errors have been sought, but may not always  have been located. Such creation errors do not reflect on  the standard of medical care.

## 2019-12-20 ENCOUNTER — Ambulatory Visit: Payer: Managed Care, Other (non HMO) | Admitting: Rheumatology

## 2019-12-20 ENCOUNTER — Other Ambulatory Visit: Payer: Self-pay

## 2019-12-20 ENCOUNTER — Encounter: Payer: Self-pay | Admitting: Physician Assistant

## 2019-12-20 VITALS — BP 105/71 | HR 75 | Resp 14 | Ht 65.0 in | Wt 210.4 lb

## 2019-12-20 DIAGNOSIS — M542 Cervicalgia: Secondary | ICD-10-CM

## 2019-12-20 DIAGNOSIS — G8929 Other chronic pain: Secondary | ICD-10-CM

## 2019-12-20 DIAGNOSIS — R7989 Other specified abnormal findings of blood chemistry: Secondary | ICD-10-CM | POA: Diagnosis not present

## 2019-12-20 DIAGNOSIS — Z79899 Other long term (current) drug therapy: Secondary | ICD-10-CM | POA: Diagnosis not present

## 2019-12-20 DIAGNOSIS — M545 Low back pain, unspecified: Secondary | ICD-10-CM

## 2019-12-20 DIAGNOSIS — Z8639 Personal history of other endocrine, nutritional and metabolic disease: Secondary | ICD-10-CM

## 2019-12-20 DIAGNOSIS — K760 Fatty (change of) liver, not elsewhere classified: Secondary | ICD-10-CM

## 2019-12-20 DIAGNOSIS — R748 Abnormal levels of other serum enzymes: Secondary | ICD-10-CM | POA: Diagnosis not present

## 2019-12-20 DIAGNOSIS — R5383 Other fatigue: Secondary | ICD-10-CM

## 2019-12-20 DIAGNOSIS — R202 Paresthesia of skin: Secondary | ICD-10-CM

## 2019-12-20 DIAGNOSIS — L439 Lichen planus, unspecified: Secondary | ICD-10-CM

## 2019-12-20 DIAGNOSIS — M359 Systemic involvement of connective tissue, unspecified: Secondary | ICD-10-CM | POA: Diagnosis not present

## 2019-12-20 NOTE — Patient Instructions (Signed)
Standing Labs We placed an order today for your standing lab work.    Please come back and get your standing labs in June and every 3 months.   We have open lab daily Monday through Thursday from 8:30-12:30 PM and 1:30-4:30 PM and Friday from 8:30-12:30 PM and 1:30-4:00 PM at the office of Dr. Shaili Deveshwar.   You may experience shorter wait times on Monday and Friday afternoons. The office is located at [address], [address], [place], [place] [address] No appointment is necessary.   Labs are drawn by [facility].  You may receive a bill from [facility] for your lab work.  If you wish to have your labs drawn at another location, please call the office 24 hours in advance to send orders.  If you have any questions regarding directions or hours of operation,  please call [phone].   Just as a reminder please drink plenty of water prior to coming for your lab work. Thanks!  

## 2019-12-23 ENCOUNTER — Encounter: Payer: Self-pay | Admitting: Internal Medicine

## 2019-12-31 ENCOUNTER — Ambulatory Visit: Payer: Managed Care, Other (non HMO) | Attending: Internal Medicine

## 2019-12-31 DIAGNOSIS — Z23 Encounter for immunization: Secondary | ICD-10-CM

## 2019-12-31 NOTE — Progress Notes (Signed)
   Covid-19 Vaccination Clinic  Name:  Ariana Scott    MRN: [id number] DOB: 11-Dec-1995  12/31/2019  [name] was observed post Covid-19 immunization for 15 minutes without incident. She was provided with Vaccine Information Sheet and instruction to access the V-Safe system.   [name] was instructed to call 911 with any severe reactions post vaccine: Marland Kitchen Difficulty breathing  . Swelling of face and throat  . A fast heartbeat  . A bad rash all over body  . Dizziness and weakness   Immunizations Administered    Name Date Dose VIS Date Route   Pfizer COVID-19 Vaccine 12/31/2019  3:44 PM 0.3 mL 11/06/2018 Intramuscular   Manufacturer: [employer], [employer]   Lot: [id number]   NDC: [address]-0

## 2020-01-06 ENCOUNTER — Ambulatory Visit: Payer: Managed Care, Other (non HMO) | Admitting: Rheumatology

## 2020-01-13 IMAGING — CT CT ABDOMEN AND PELVIS WITH CONTRAST (ENTEROGRAPHY)
2 of 5 series · 16 of 46 positions shown, 18 images · IV contrast (iopamidol)
Comparison: None.

CLINICAL DATA: Diarrhea x4 years

EXAM:
CT ABDOMEN AND PELVIS WITH CONTRAST (ENTEROGRAPHY)
TECHNIQUE: Multidetector CT of the abdomen and pelvis during bolus
administration of intravenous contrast. Negative oral contrast was
given.
CONTRAST:  100mL 6R8URR-CII IOPAMIDOL (6R8URR-CII) INJECTION 61%

[Series 4: enterography 2.00 br40 s3 cor · coronal · 0.74mm/px · 3 of 152 slices shown]
[im 51/152  soft-tissue]
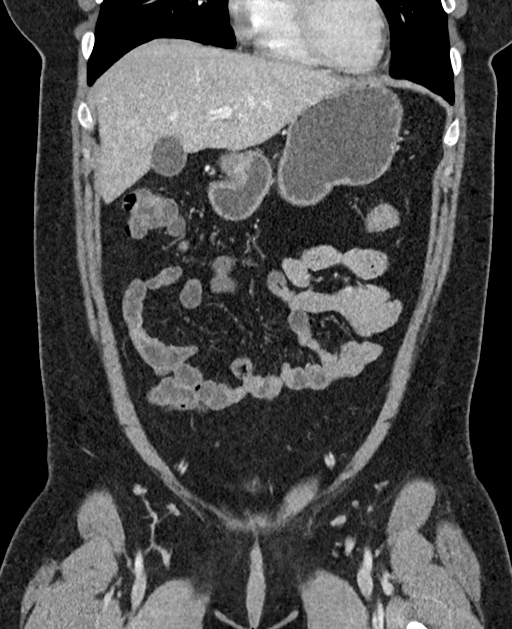
[im 68/152  soft-tissue]
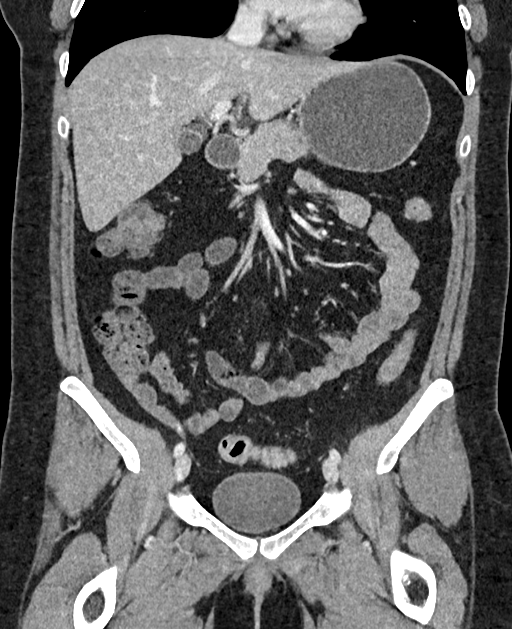
[im 84/152  soft-tissue]
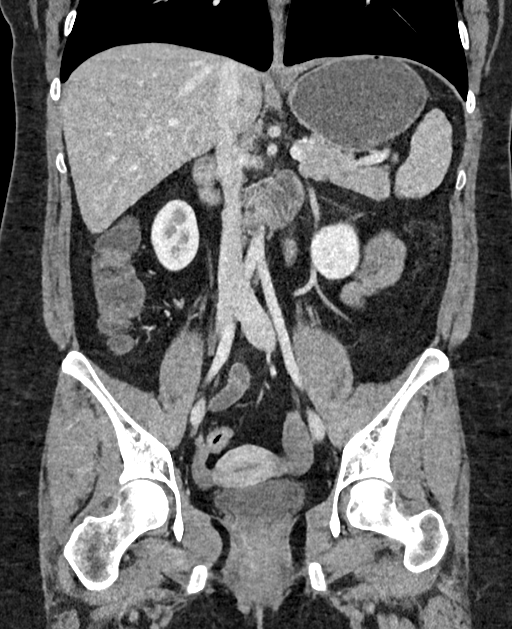

[Series 9: enterography 3.00 br40 s3 axial 3mm · axial · 0.74mm/px · z∈[+1077,+1482]mm · 13 of 155 slices shown, 15 images]
[im 10/155  soft-tissue]
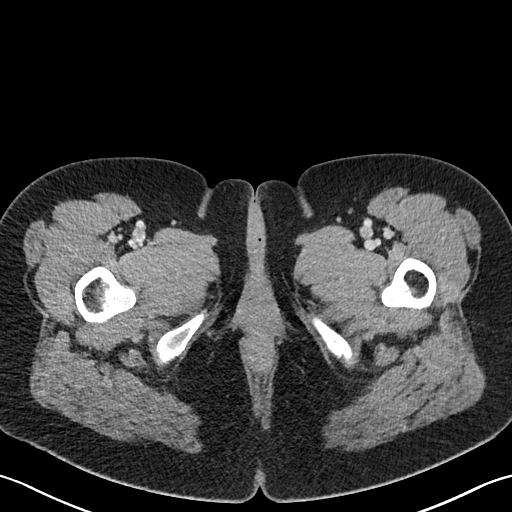
[im 10/155  bone]
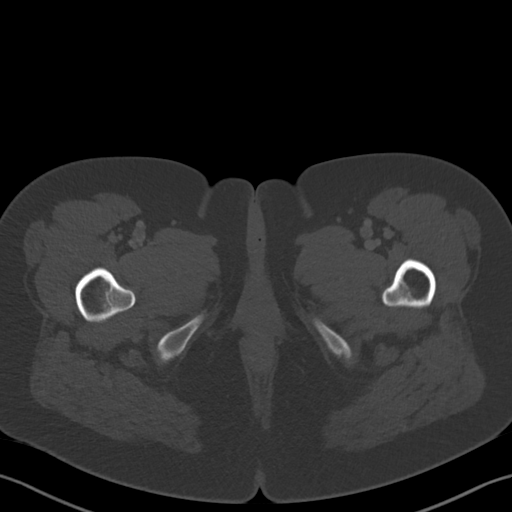
[im 19/155  soft-tissue]
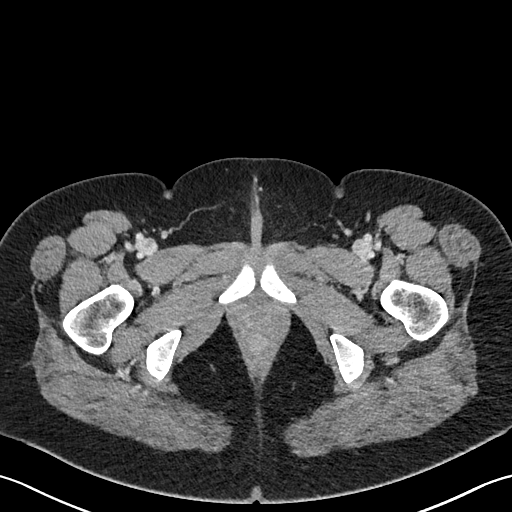
[im 37/155  soft-tissue]
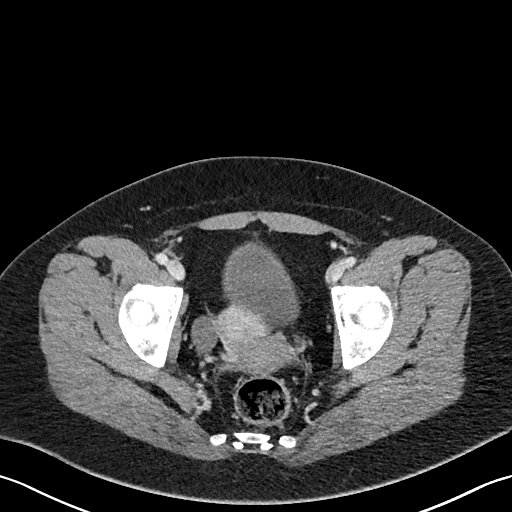
[im 46/155  soft-tissue]
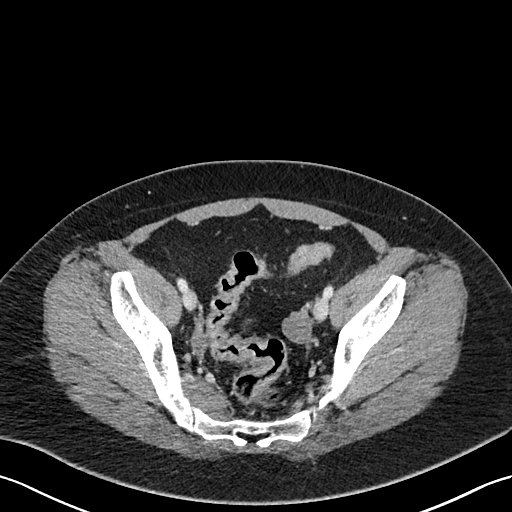
[im 55/155  soft-tissue]
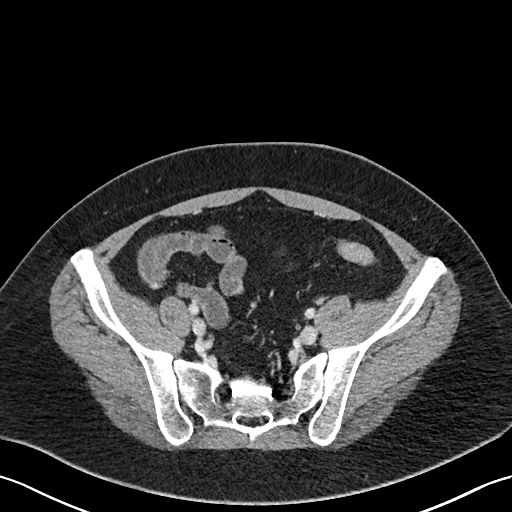
[im 64/155  soft-tissue]
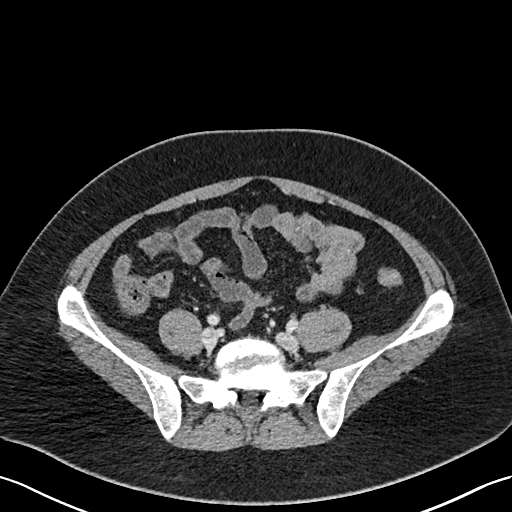
[im 82/155  soft-tissue]
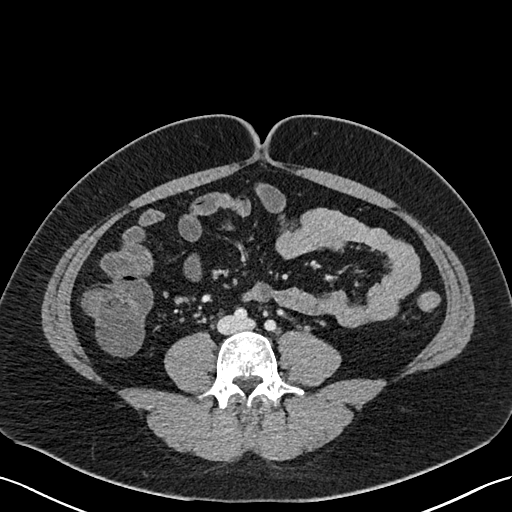
[im 91/155  soft-tissue]
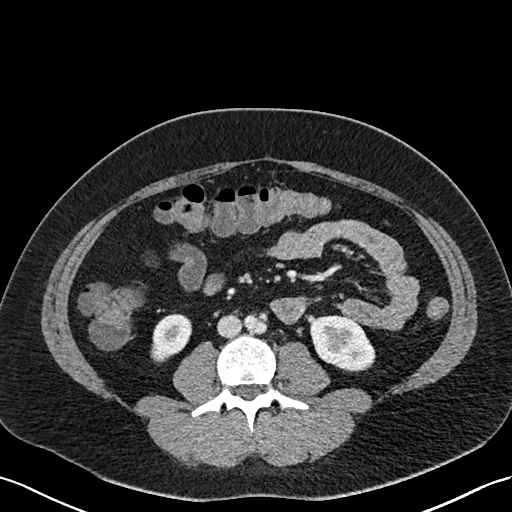
[im 100/155  soft-tissue]
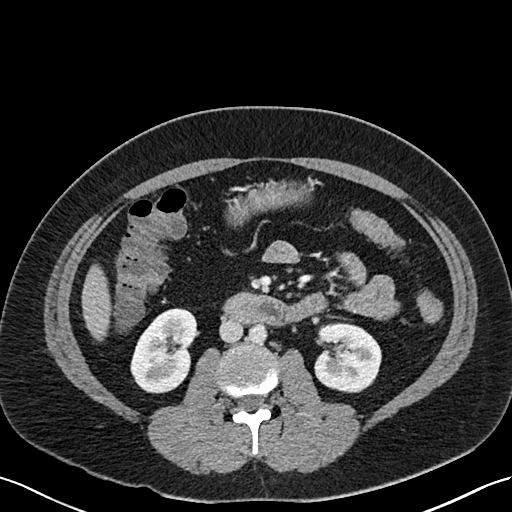
[im 100/155  bone]
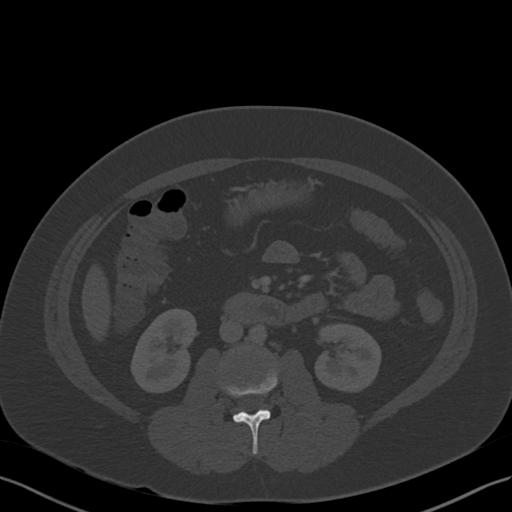
[im 109/155  soft-tissue]
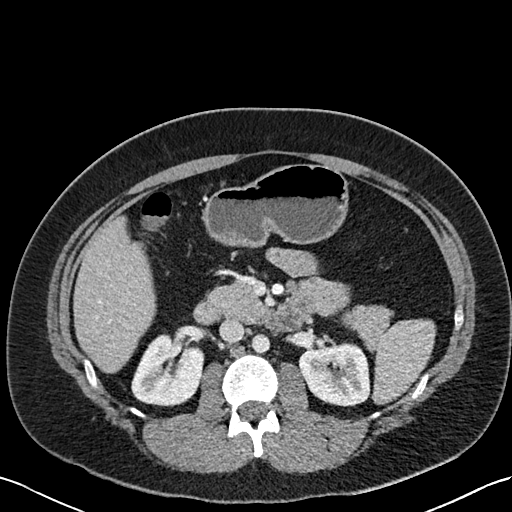
[im 118/155  soft-tissue]
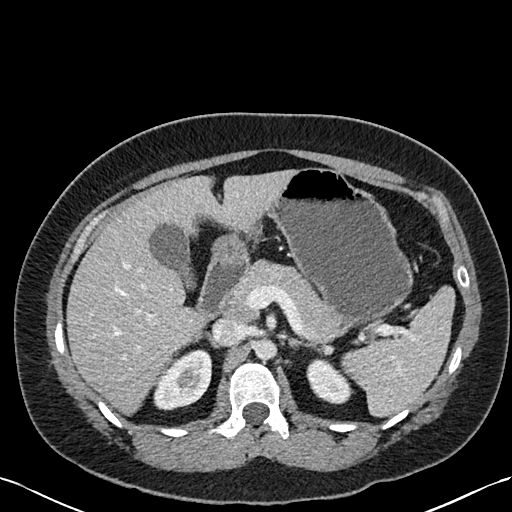
[im 136/155  soft-tissue]
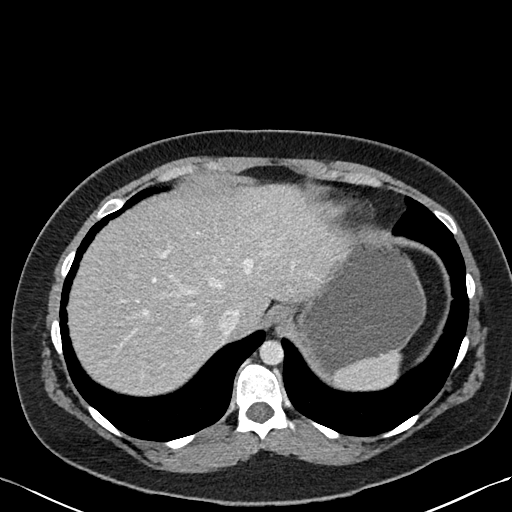
[im 145/155  soft-tissue]
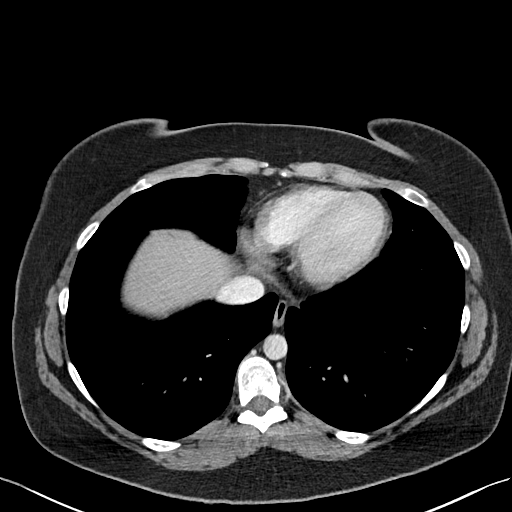

[16 of 46 positions shown; findings below may reference images not displayed]

FINDINGS: Lower chest:  Lung bases are clear.

Hepatobiliary: 12 mm hypoenhancing lesion in segment 7 (series
2/image 15), poorly visualized but likely reflecting a benign
hemangioma.

Gallbladder is unremarkable. No intrahepatic or extrahepatic ductal
dilatation.

Pancreas: Within normal limits.

Spleen: Within normal limits.

Adrenals/Urinary Tract: Adrenal glands are within normal limits.

Kidneys are within normal limits. No hydronephrosis.

Bladder is within normal limits.

Stomach/Bowel: Stomach is within normal limits.

No evidence of bowel obstruction.

No small bowel wall thickening or inflammatory changes.
Specifically, the terminal ileum is within normal limits.

Normal appendix (series 2/image 61).

Mild rectal wall thickening (series 2/image 76).

Vascular/Lymphatic: No evidence of abdominal aortic aneurysm.

No suspicious abdominopelvic lymphadenopathy.

Reproductive: Uterus is within normal limits.

Bilateral ovaries are within normal limits.

Other: No abdominopelvic ascites.

Musculoskeletal: Visualized osseous structures are within normal
limits.
IMPRESSION: Mild rectal wall thickening. Correlate for infectious/inflammatory
proctitis such as ulcerative colitis.

No small bowel wall thickening or inflammatory changes.
Specifically, the terminal ileum is within normal limits.

## 2020-02-17 ENCOUNTER — Other Ambulatory Visit: Payer: Self-pay | Admitting: *Deleted

## 2020-02-17 DIAGNOSIS — R202 Paresthesia of skin: Secondary | ICD-10-CM

## 2020-02-17 DIAGNOSIS — Z79899 Other long term (current) drug therapy: Secondary | ICD-10-CM

## 2020-02-18 LAB — COMPLETE METABOLIC PANEL WITH GFR
AG Ratio: 1.3 (calc) (ref 1.0–2.5)
ALT: 30 U/L — ABNORMAL HIGH (ref 6–29)
AST: 37 U/L — ABNORMAL HIGH (ref 10–30)
Albumin: 4.3 g/dL (ref 3.6–5.1)
Alkaline phosphatase (APISO): 35 U/L (ref 31–125)
BUN: 8 mg/dL (ref 7–25)
CO2: 27 mmol/L (ref 20–32)
Calcium: 9.9 mg/dL (ref 8.6–10.2)
Chloride: 103 mmol/L (ref 98–110)
Creat: 0.77 mg/dL (ref 0.50–1.10)
GFR, Est African American: 125 mL/min/{1.73_m2} (ref >=60)
GFR, Est Non African American: 108 mL/min/{1.73_m2} (ref >=60)
Globulin: 3.3 g/dL (calc) (ref 1.9–3.7)
Glucose, Bld: 76 mg/dL (ref 65–99)
Potassium: 4.4 mmol/L (ref 3.5–5.3)
Sodium: 138 mmol/L (ref 135–146)
Total Bilirubin: 0.3 mg/dL (ref 0.2–1.2)
Total Protein: 7.6 g/dL (ref 6.1–8.1)

## 2020-02-18 LAB — CBC WITH DIFFERENTIAL/PLATELET
Absolute Monocytes: 482 cells/uL (ref 200–950)
Basophils Absolute: 21 cells/uL (ref 0–200)
Basophils Relative: 0.4 %
Eosinophils Absolute: 69 cells/uL (ref 15–500)
Eosinophils Relative: 1.3 %
HCT: 45.8 % — ABNORMAL HIGH (ref 35.0–45.0)
Hemoglobin: 15.4 g/dL (ref 11.7–15.5)
Lymphs Abs: 1839 cells/uL (ref 850–3900)
MCH: 29.6 pg (ref 27.0–33.0)
MCHC: 33.6 g/dL (ref 32.0–36.0)
MCV: 88.1 fL (ref 80.0–100.0)
MPV: 9.7 fL (ref 7.5–12.5)
Monocytes Relative: 9.1 %
Neutro Abs: 2889 cells/uL (ref 1500–7800)
Neutrophils Relative %: 54.5 %
Platelets: 248 10*3/uL (ref 140–400)
RBC: 5.2 10*6/uL — ABNORMAL HIGH (ref 3.80–5.10)
RDW: 12.4 % (ref 11.0–15.0)
Total Lymphocyte: 34.7 %
WBC: 5.3 10*3/uL (ref 3.8–10.8)

## 2020-02-18 LAB — HEMOGLOBIN A1C
Hgb A1c MFr Bld: 4.9 % of total Hgb (ref <5.7)
Mean Plasma Glucose: 94 (calc)
eAG (mmol/L): 5.2 (calc)

## 2020-02-18 NOTE — Progress Notes (Signed)
Hgb A1c WNL-4.9.

## 2020-02-21 ENCOUNTER — Other Ambulatory Visit: Payer: Self-pay | Admitting: Rheumatology

## 2020-02-21 DIAGNOSIS — R768 Other specified abnormal immunological findings in serum: Secondary | ICD-10-CM

## 2020-02-21 NOTE — Telephone Encounter (Signed)
Last Visit: 12/20/2019 Next Visit: 03/23/2020 Labs: 02/17/2020 LFTs are borderline elevated. RBC count and hematocrit are borderline elevated but stable. We will continue to monitor. Rest of CBC WNL. Eye exam: 06/25/2019 normal  Current Dose per office note 12/20/2019: Plaquenil 200 mg 1 tablet twice daily  Okay to refill per Dr. Corliss Skains

## 2020-03-10 NOTE — Progress Notes (Signed)
Office Visit Note  Patient: Ariana Scott             Date of Birth: [birth year]           MRN: [id number]             PCP: Mancel Bale, PA-C Referring: Mancel Bale, PA-C Visit Date: 03/23/2020 Occupation: _0 @  Subjective:  SI joint pain bilaterally   History of Present Illness: Ariana Scott is a 24 y.o. female with history of autoimmune disease.  Patient is taking Plaquenil 200 mg 1 tablet by mouth twice daily.  She is tolerating Plaquenil without any side effects.  She has noticed improvement in her joint pain and inflammation since starting on Plaquenil.  She states that the paresthesias in both of her feet have resolved.  She states that she was recently diagnosed with eczema on the palmar aspect of both hands and has been using a petroleum ointment which has been helpful.  She reports that she continues to have intermittent lower back pain.  She denies any symptoms of radiculopathy or muscle spasms.  She has tried performing several yoga poses and has temporary relief after performing these.  Patient reports that within the last several months she has had 2 migraines which is a new concern.  She has not seen a neurologist.   Activities of Daily Living:  Patient reports morning stiffness for 3 hours.   Patient Reports nocturnal pain.  Difficulty dressing/grooming: Denies Difficulty climbing stairs: Denies Difficulty getting out of chair: Denies Difficulty using hands for taps, buttons, cutlery, and/or writing: Denies  Review of Systems  Constitutional: Negative for fatigue.  HENT: Negative for mouth sores, mouth dryness and nose dryness.   Eyes: Negative for pain, visual disturbance and dryness.  Respiratory: Negative for cough, hemoptysis, shortness of breath and difficulty breathing.   Cardiovascular: Negative for chest pain, palpitations, hypertension and swelling in legs/feet.  Gastrointestinal: Positive for diarrhea. Negative for blood in stool and  constipation.  Endocrine: Positive for excessive thirst and increased urination.  Genitourinary: Negative for difficulty urinating and painful urination.  Musculoskeletal: Positive for arthralgias, joint pain, joint swelling, morning stiffness and muscle tenderness. Negative for myalgias, muscle weakness and myalgias.  Skin: Positive for redness. Negative for color change, pallor, rash, hair loss, nodules/bumps, skin tightness, ulcers and sensitivity to sunlight.  Allergic/Immunologic: Negative for susceptible to infections.  Neurological: Negative for dizziness, numbness, headaches and weakness.  Hematological: Negative for bruising/bleeding tendency and swollen glands.  Psychiatric/Behavioral: Negative for depressed mood and sleep disturbance. The patient is not nervous/anxious.     PMFS History:  Patient Active Problem List   Diagnosis Date Noted  . Chronic midline low back pain without sciatica 07/08/2019  . Elevated LFTs 07/08/2019  . Fatty liver 07/08/2019  . Autoimmune disease ([place]) 07/08/2019  . Anxiety 05/01/2019  . Lichen planus 24/26/8341  . Other acne 05/01/2019  . Hypothyroidism (acquired) 01/24/2019    Past Medical History:  Diagnosis Date  . Autoimmune disease ([place])   . Eczema   . Erosive lichen planus of vulva   . Hashimoto's disease   . Hypothyroidism   . IBS (irritable bowel syndrome)   . Polycystic ovarian syndrome     Family History  Problem Relation Age of Onset  . Diabetes Mother   . Depression Mother   . ADD / ADHD Mother   . Depression Father   . Vitamin D deficiency Father   . Colitis Father   . Hypertension  Father   . High Cholesterol Father   . ADD / ADHD Brother    Past Surgical History:  Procedure Laterality Date  . COLONOSCOPY  2020   Dr. Collene Mares  . UPPER GASTROINTESTINAL ENDOSCOPY  2020   Dr. Collene Mares  . WISDOM TOOTH EXTRACTION  2016   Social History   Social History Narrative  . Not on file   Immunization History  Administered  Date(s) Administered  . DTaP 10/16/1995, 11/29/1995, 01/11/1996, 01/10/1997, 04/28/2000  . HPV Quadrivalent 05/21/2007, 07/03/2008  . Hepatitis A 05/21/2007, 07/03/2008  . Hepatitis B 10/24/94, 08/01/1995, 01/11/1996  . HiB (PRP-OMP) 10/16/1995, 11/29/1995, 01/11/1996, 01/10/1997  . IPV 10/16/1995, 11/29/1995, 01/11/1996, 04/28/2000  . MMR 07/02/1996, 04/28/2000  . Meningococcal Conjugate 05/21/2007  . PFIZER SARS-COV-2 Vaccination 12/07/2019, 12/31/2019  . Tdap 04/23/2007     Objective: Vital Signs: BP 118/84 (BP Location: Left Arm, Patient Position: Sitting, Cuff Size: Normal)   Pulse 69   Resp 14   Ht 5' 4.5" (1.638 m)   Wt 208 lb 9.6 oz (94.6 kg)   BMI 35.25 kg/m    Physical Exam Vitals and nursing note reviewed.  Constitutional:      Appearance: She is well-developed.  HENT:     Head: Normocephalic and atraumatic.  Eyes:     Conjunctiva/sclera: Conjunctivae normal.  Pulmonary:     Effort: Pulmonary effort is normal.  Abdominal:     General: Bowel sounds are normal.     Palpations: Abdomen is soft.  Musculoskeletal:     Cervical back: Normal range of motion.  Lymphadenopathy:     Cervical: No cervical adenopathy.  Skin:    General: Skin is warm and dry.     Capillary Refill: Capillary refill takes less than 2 seconds.  Neurological:     Mental Status: She is alert and oriented to person, place, and time.  Psychiatric:        Behavior: Behavior normal.      Musculoskeletal Exam: C-spine, thoracic spine, and lumbar spine good ROM.  No midline spinal tenderness.  She has tenderness over both SI joints.  Shoulder joints, elbow joints, wrist joints, MCPs, PIPs, DIPs have good range of motion with no synovitis.  She has complete fist formation bilaterally.  Hip joints have good range of motion with no discomfort.  No tenderness over trochanteric bursa bilaterally.  Knee joints have good range of motion with no warmth or effusion.  Ankle joints have good range of  motion with no tenderness or inflammation.  CDAI Exam: CDAI Score: -- Patient Global: --; Provider Global: -- Swollen: --; Tender: -- Joint Exam 03/23/2020   No joint exam has been documented for this visit   There is currently no information documented on the homunculus. Go to the Rheumatology activity and complete the homunculus joint exam.  Investigation: No additional findings.  Imaging: No results found.  Recent Labs: Lab Results  Component Value Date   WBC 5.3 02/17/2020   HGB 15.4 02/17/2020   PLT 248 02/17/2020   NA 138 02/17/2020   K 4.4 02/17/2020   CL 103 02/17/2020   CO2 27 02/17/2020   GLUCOSE 76 02/17/2020   BUN 8 02/17/2020   CREATININE 0.77 02/17/2020   BILITOT 0.3 02/17/2020   AST 37 (H) 02/17/2020   ALT 30 (H) 02/17/2020   PROT 7.6 02/17/2020   CALCIUM 9.9 02/17/2020   GFRAA 125 02/17/2020    Speciality Comments: PLQ eye exam: 06/25/2019 normal. [facility]. Follow up in 6 months.  Procedures:  No procedures performed Allergies: Kiwi extract and Pineapple   Assessment / Plan:     Visit Diagnoses: Autoimmune disease ([place]) - Positive ANA, double-stranded DNA, positive Ro, history of photosensitivity, facial rash and elevated LFTs: She is clinically doing well on Plaquenil 200 mg 1 tablet by mouth twice daily.  She is tolerating Plaquenil without any side effects.  She continues to notice clinical improvement while taking Plaquenil.  She has no synovitis on exam today.  She has not had any recent oral or nasal ulcerations.  She has not had any sicca symptoms recently.  She denies any symptoms of Raynaud's.  No digital ulcerations or signs of gangrene were noted.  She will continue taking Plaquenil as prescribed.  She does not need a refill at this time.  We will obtain autoimmune labs at her follow-up visit.  She was advised to notify us if she develops any new or worsening symptoms.  She will follow-up in the office in 5 months.  High risk medication  use - Plaquenil 200 mg 1 tablet twice daily started in September 2020. PLQ eye exam: 06/25/2019.  CBC and CMP were drawn on 02/17/2020 and were reviewed with the patient today in the office.  LFTs have improved significantly.  Elevated CK: CK was 98 on 11/25/2019.  She is not experiencing any increased myalgias or muscle weakness at this time.  Elevated LFTs - Fatty liver vs. Autoimmune disease.  Her LFTs have improved significantly.  AST was 37 and ALT was 30 on 02/17/2020.  She continues to follow-up with Dr. Collene Mares.  Chronic SI joint pain: She experiences intermittent SI joint pain bilaterally.  She has tenderness to palpation on exam today.  We discussed the importance of performing stretching exercises on a daily basis and changing positions frequently.  She was given a handout of information about SI joint dysfunction.  She was advised to notify us if her symptoms persist or worsen and we will get an updated x-ray of the pelvis.  X-rays of the lumbar spine were obtained on 05/01/2019 which were reviewed with today in the office.  No SI joint sclerosis was noted at that time.  Eczema of both hands: She was recently diagnosed with eczema on the palmar aspect of both hands.  She has been using a topical petroleum oil which has improved her symptoms significantly.  Paresthesia of left foot: Resolved.  Other fatigue: Chronic but stable.  Other medical conditions are listed as follows:  Fatty liver: LFTs have improved significantly.  AST was 37 and ALT was 30 on 02/17/2020.  We will continue to monitor lab work every 3 months.  Orders for CBC and CMP were placed today.  Lichen planus  History of hypothyroidism  Orders: Orders Placed This Encounter  Procedures  . Anti-DNA antibody, double-stranded  . C3 and C4  . Sedimentation rate  . COMPLETE METABOLIC PANEL WITH GFR  . CBC with Differential/Platelet  . Urinalysis, Routine w reflex microscopic   No orders of the defined types were placed in  this encounter.     Follow-Up Instructions: Return in about 5 months (around 08/23/2020) for Autoimmune Disease.   Ofilia Neas, PA-C  Note - This record has been created using Dragon software.  Chart creation errors have been sought, but may not always  have been located. Such creation errors do not reflect on  the standard of medical care.

## 2020-03-23 ENCOUNTER — Other Ambulatory Visit: Payer: Self-pay

## 2020-03-23 ENCOUNTER — Ambulatory Visit: Payer: Managed Care, Other (non HMO) | Admitting: Physician Assistant

## 2020-03-23 ENCOUNTER — Encounter: Payer: Self-pay | Admitting: Physician Assistant

## 2020-03-23 VITALS — BP 118/84 | HR 69 | Resp 14 | Ht 64.5 in | Wt 208.6 lb

## 2020-03-23 DIAGNOSIS — K760 Fatty (change of) liver, not elsewhere classified: Secondary | ICD-10-CM

## 2020-03-23 DIAGNOSIS — R748 Abnormal levels of other serum enzymes: Secondary | ICD-10-CM | POA: Diagnosis not present

## 2020-03-23 DIAGNOSIS — R7989 Other specified abnormal findings of blood chemistry: Secondary | ICD-10-CM | POA: Diagnosis not present

## 2020-03-23 DIAGNOSIS — M545 Low back pain, unspecified: Secondary | ICD-10-CM

## 2020-03-23 DIAGNOSIS — L309 Dermatitis, unspecified: Secondary | ICD-10-CM

## 2020-03-23 DIAGNOSIS — L439 Lichen planus, unspecified: Secondary | ICD-10-CM

## 2020-03-23 DIAGNOSIS — M359 Systemic involvement of connective tissue, unspecified: Secondary | ICD-10-CM

## 2020-03-23 DIAGNOSIS — G8929 Other chronic pain: Secondary | ICD-10-CM

## 2020-03-23 DIAGNOSIS — R5383 Other fatigue: Secondary | ICD-10-CM

## 2020-03-23 DIAGNOSIS — Z79899 Other long term (current) drug therapy: Secondary | ICD-10-CM

## 2020-03-23 DIAGNOSIS — M533 Sacrococcygeal disorders, not elsewhere classified: Secondary | ICD-10-CM

## 2020-03-23 DIAGNOSIS — R202 Paresthesia of skin: Secondary | ICD-10-CM

## 2020-03-23 DIAGNOSIS — Z8639 Personal history of other endocrine, nutritional and metabolic disease: Secondary | ICD-10-CM

## 2020-03-23 NOTE — Patient Instructions (Signed)
Sacroiliac Joint Dysfunction  Sacroiliac joint dysfunction is a condition that causes inflammation on one or both sides of the sacroiliac (SI) joint. The SI joint connects the lower part of the spine (sacrum) with the two upper portions of the pelvis (ilium). This condition causes deep aching or burning pain in the low back. In some cases, the pain may also spread into one or both buttocks, hips, or thighs. What are the causes? This condition may be caused by:  Pregnancy. During pregnancy, extra stress is put on the SI joints because the pelvis widens.  Injury, such as: ? Injuries from car accidents. ? Sports-related injuries. ? Work-related injuries.  Having one leg that is shorter than the other.  Conditions that affect the joints, such as: ? Rheumatoid arthritis. ? Gout. ? Psoriatic arthritis. ? Joint infection (septic arthritis). Sometimes, the cause of SI joint dysfunction is not known. What are the signs or symptoms? Symptoms of this condition include:  Aching or burning pain in the lower back. The pain may also spread to other areas, such as: ? Buttocks. ? Groin. ? Thighs.  Muscle spasms in or around the painful areas.  Increased pain when standing, walking, running, stair climbing, bending, or lifting. How is this diagnosed? This condition is diagnosed with a physical exam and medical history. During the exam, the health care provider may move one or both of your legs to different positions to check for pain. Various tests may be done to confirm the diagnosis, including:  Imaging tests to look for other causes of pain. These may include: ? MRI. ? CT scan. ? Bone scan.  Diagnostic injection. A numbing medicine is injected into the SI joint using a needle. If your pain is temporarily improved or stopped after the injection, this can indicate that SI joint dysfunction is the problem. How is this treated? Treatment depends on the cause and severity of your condition.  Treatment options may include:  Ice or heat applied to the lower back area after an injury. This may help reduce pain and muscle spasms.  Medicines to relieve pain or inflammation or to relax the muscles.  Wearing a back brace (sacroiliac brace) to help support the joint while your back is healing.  Physical therapy to increase muscle strength around the joint and flexibility at the joint. This may also involve learning proper body positions and ways of moving to relieve stress on the joint.  Direct manipulation of the SI joint.  Injections of steroid medicine into the joint to reduce pain and swelling.  Radiofrequency ablation to burn away nerves that are carrying pain messages from the joint.  Use of a device that provides electrical stimulation to help reduce pain at the joint.  Surgery to put in screws and plates that limit or prevent joint motion. This is rare. Follow these instructions at home: Medicines  Take over-the-counter and prescription medicines only as told by your health care provider.  Do not drive or use heavy machinery while taking prescription pain medicine.  If you are taking prescription pain medicine, take actions to prevent or treat constipation. Your health care provider may recommend that you: ? Drink enough fluid to keep your urine pale yellow. ? Eat foods that are high in fiber, such as fresh fruits and vegetables, whole grains, and beans. ? Limit foods that are high in fat and processed sugars, such as fried or sweet foods. ? Take an over-the-counter or prescription medicine for constipation. If you have a brace:    Wear the brace as told by your health care provider. Remove it only as told by your health care provider.  Keep the brace clean.  If the brace is not waterproof: ? Do not let it get wet. ? Cover it with a watertight covering when you take a bath or a shower. Managing pain, stiffness, and swelling      Icing can help with pain and  swelling. Heat may help with muscle tension or spasms. Ask your health care provider if you should use ice or heat.  If directed, put ice on the affected area: ? If you have a removable brace, remove it as told by your health care provider. ? Put ice in a plastic bag. ? Place a towel between your skin and the bag. ? Leave the ice on for 20 minutes, 2-3 times a day.  If directed, apply heat to the affected area. Use the heat source that your health care provider recommends, such as a moist heat pack or a heating pad. ? Place a towel between your skin and the heat source. ? Leave the heat on for 20-30 minutes. ? Remove the heat if your skin turns bright red. This is especially important if you are unable to feel pain, heat, or cold. You may have a greater risk of getting burned. General instructions  Rest as needed. Ask your health care provider what activities are safe for you.  Return to your normal activities as told by your health care provider.  Exercise as directed by your health care provider or physical therapist.  Do not use any products that contain nicotine or tobacco, such as cigarettes and e-cigarettes. These can delay bone healing. If you need help quitting, ask your health care provider.  Keep all follow-up visits as told by your health care provider. This is important. Contact a health care provider if:  Your pain is not controlled with medicine.  You have a fever.  Your pain is getting worse. Get help right away if:  You have weakness, numbness, or tingling in your legs or feet.  You lose control of your bladder or bowel. Summary  Sacroiliac joint dysfunction is a condition that causes inflammation on one or both sides of the sacroiliac (SI) joint.  This condition causes deep aching or burning pain in the low back. In some cases, the pain may also spread into one or both buttocks, hips, or thighs.  Treatment depends on the cause and severity of your condition.  It may include medicines to reduce pain and swelling or to relax muscles. This information is not intended to replace advice given to you by your health care provider. Make sure you discuss any questions you have with your health care provider. Document Revised: 04/25/2018 Document Reviewed: 10/09/2017 [employer] Patient Education  2020 [employer]  

## 2020-03-24 ENCOUNTER — Encounter: Payer: Self-pay | Admitting: Internal Medicine

## 2020-03-24 ENCOUNTER — Ambulatory Visit: Payer: Managed Care, Other (non HMO) | Admitting: Internal Medicine

## 2020-03-24 ENCOUNTER — Other Ambulatory Visit: Payer: Self-pay

## 2020-03-24 VITALS — BP 110/80 | HR 70 | Ht 64.5 in | Wt 209.0 lb

## 2020-03-24 DIAGNOSIS — E038 Other specified hypothyroidism: Secondary | ICD-10-CM

## 2020-03-24 DIAGNOSIS — E063 Autoimmune thyroiditis: Secondary | ICD-10-CM | POA: Diagnosis not present

## 2020-03-24 DIAGNOSIS — G43109 Migraine with aura, not intractable, without status migrainosus: Secondary | ICD-10-CM

## 2020-03-24 LAB — T4, FREE: Free T4: 0.9 ng/dL (ref 0.60–1.60)

## 2020-03-24 LAB — TSH: TSH: 2.35 u[IU]/mL (ref 0.35–4.50)

## 2020-03-24 NOTE — Patient Instructions (Addendum)
Please continue: - Levothyroxine 75 mcg every day  Take the thyroid hormone every day, with water, at least 30 minutes before breakfast, separated by at least 4 hours from: - acid reflux medications - calcium - iron - multivitamins  Please discuss with ObGyn about the OCPs.  Please come back for a follow-up appointment in 6 months.

## 2020-03-24 NOTE — Progress Notes (Signed)
Patient ID: Ariana Scott, female   DOB: April 27, 1996, 24 y.o.   MRN: [id number]   This visit occurred during the SARS-CoV-2 public health emergency.  Safety protocols were in place, including screening questions prior to the visit, additional usage of staff PPE, and extensive cleaning of exam room while observing appropriate contact time as indicated for disinfecting solutions.   HPI  Ariana Scott is a 24 y.o.-year-old female, initially referred by Dr. Loreta Ave, returning for follow-up for Hashimoto's hypothyroidism.  Last visit 6 months ago-virtual.  Reviewed and addended history: Pt. was diagnosed with hypothyroidism in 11/2018 during investigation for IBS by Dr. Loreta Ave.    Patient has had constipation for years, but she then started to have loose stools (for 4 years) and was sent to GI.  Dr. Loreta Ave checked her TSH which returned very high, at 42.35.   She was started on levothyroxine 75 mcg daily and referred to endocrinology.  In the past, she was taking 52 to 59 mcg levothyroxine daily, but was having palpitations so we switched to 50 mcg tablets, which are white.  She tolerates these well.  Pt was on levothyroxine 100 mcg (2x50 mcg) daily at last visit, but since last visit, she changed to 75 mcg daily at this was mentioned on the bottle she obtained from the pharmacy... She takes this: - in am - fasting - at least 1h from b'fast - no Ca, Fe, MVI, PPIs - not on Biotin - on fish oil  I reviewed patient's TFTs: Lab Results  Component Value Date   TSH 2.58 11/11/2019   TSH 4.19 09/16/2019   TSH 6.97 (H) 07/23/2019   TSH 5.72 (H) 06/18/2019   TSH 85.26 (H) 04/01/2019   TSH 1.38 01/24/2019   FREET4 0.91 11/11/2019   FREET4 0.91 09/16/2019   FREET4 0.82 07/23/2019   FREET4 0.82 06/18/2019   FREET4 0.46 (L) 04/01/2019   FREET4 0.97 01/24/2019  12/11/2018: TSH 42.35  Her antithyroid antibodies were high, giving her a diagnosis of Hashimoto's thyroiditis: Component     Latest Ref Rng  & Units 01/24/2019  Thyroperoxidase Ab SerPl-aCnc     <9 IU/mL 395 (H)  Thyroglobulin Ab     < or = 1 IU/mL 2 (H)   She previously had palpitations, which improved after changing levothyroxine tablets to white once (50 mcg).  Pt denies: - feeling nodules in neck - hoarseness - dysphagia - choking - SOB with lying down  She has + FH of thyroid disorders in: PGGF. No FH of thyroid cancer. No h/o radiation tx to head or neck.  No seaweed or kelp. No recent contrast studies. No herbal supplements. No Biotin use. No recent steroids use.   She also has a history of trochanteric bursitis.  She has PCOS and has been on OCPs for years for irregular menses.  She describes that she had 2 migraines with visual aura in the last month.  She was also found to have elevated LFTs at recent investigation by Dr. Loreta Ave.  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  She had a liver ultrasound >> fatty liver. She may need to have a liver Bx.  ROS: Constitutional: no weight gain/no weight loss, no fatigue, no subjective hyperthermia, no subjective hypothermia, + increased urination and thirsty (HbA1c 4.9% ) Eyes: no blurry vision, no xerophthalmia ENT: no sore throat, + see HPI Cardiovascular: no CP/no SOB/no palpitations/no leg swelling Respiratory: no cough/no SOB/no wheezing Gastrointestinal: no  N/no V/no D/no C/no acid reflux Musculoskeletal: no muscle aches/no joint aches Skin: no rashes, no hair loss Neurological: no tremors/no numbness/no tingling/no dizziness, + HAs - 2 migraines  I reviewed pt's medications, allergies, PMH, social hx, family hx, and changes were documented in the history of present illness. Otherwise, unchanged from my initial visit note.  Past Medical History:  Diagnosis Date  . Autoimmune disease (HCC)   . Eczema   . Erosive lichen planus of vulva   . Hashimoto's disease   . Hypothyroidism   . IBS (irritable bowel syndrome)   .  Polycystic ovarian syndrome    Past Surgical History:  Procedure Laterality Date  . COLONOSCOPY  2020   Dr. Loreta Ave  . UPPER GASTROINTESTINAL ENDOSCOPY  2020   Dr. Loreta Ave  . WISDOM TOOTH EXTRACTION  2016   Social History   Socioeconomic History  . Marital status: Single    Spouse name: Not on file  . Number of children: 0  . Years of education: Not on file  . Highest education level: Not on file  Occupational History  .  Recent college graduate, unemployed  Social Needs  . Financial resource strain: Not on file  . Food insecurity:    Worry: Not on file    Inability: Not on file  . Transportation needs:    Medical: Not on file    Non-medical: Not on file  Tobacco Use  . Smoking status: Never Smoker  . Smokeless tobacco: Never Used  Substance and Sexual Activity  . Alcohol use:  1 glass of wine,  . Drug use: No   Current Outpatient Medications on File Prior to Visit  Medication Sig Dispense Refill  . Azelaic Acid 15 % cream Apply on the entire face daily after cleansing    . clobetasol ointment (TEMOVATE) 0.05 % APPLY A THIN LAYER TOPICALLY TO THE AFFECTED AREA(S) 2X A DAY FOR 1WK, EVERY OTHER DAY 1WK, 2X A WK.    . hydroxychloroquine (PLAQUENIL) 200 MG tablet TAKE 1 TABLET BY MOUTH TWICE A DAY 180 tablet 0  . levothyroxine (SYNTHROID) 50 MCG tablet     . VELIVET 0.1/0.125/0.15 -0.025 MG tablet Take 1 tablet by mouth daily.     No current facility-administered medications on file prior to visit.   Allergies  Allergen Reactions  . Kiwi Extract Itching  . Pineapple Itching   Family history: -Mother, aunt with diabetes -Father with HTN and HL -Cancer in paternal grandmother: Breast and uterus + See HPI  PE: BP 110/80   Pulse 70   Ht 5' 4.5" (1.638 m)   Wt 209 lb (94.8 kg)   SpO2 98%   BMI 35.32 kg/m  Wt Readings from Last 3 Encounters:  03/24/20 209 lb (94.8 kg)  03/23/20 208 lb 9.6 oz (94.6 kg)  12/20/19 210 lb 6.4 oz (95.4 kg)   Constitutional: overweight,  in NAD Eyes: PERRLA, EOMI, no exophthalmos ENT: moist mucous membranes, no thyromegaly, no cervical lymphadenopathy Cardiovascular: RRR, No MRG Respiratory: CTA B Gastrointestinal: abdomen soft, NT, ND, BS+ Musculoskeletal: no deformities, strength intact in all 4 Skin: moist, warm, no rashes Neurological: no tremor with outstretched hands, DTR normal in all 4  ASSESSMENT: 1. Hypothyroidism -Due to Hashimoto's thyroiditis  2.  Migraines with aura  PLAN:  1. Patient with history of acquired hypothyroidism, with positive antithyroid antibodies >> Hashimoto's hypothyroidism.  She initially had symptoms consistent with hypothyroidism: Weight gain, dry skin, change in bowel habits, but also anxiety, tachycardia, tremors.  She does take anxiolytics as needed.  She was started on levothyroxine and felt better afterwards, with less fatigue.  She initially had headaches, but these resolved.  She did develop palpitations, which resolved after switching to white levothyroxine tablets. - latest thyroid labs reviewed with pt >> normal: Lab Results  Component Value Date   TSH 2.58 11/11/2019   - she is on LT4 75 mcg daily per instructions on the bottle from the pharmacy, however, per review of the chart, he was supposed to be on 100 mcg daily.  I advised her to always check with me if she sees a change in her levothyroxine dose without as discussing about it first. - pt feels good on this dose, but she does describe new migraines x2 and increased urination and thirst (recent HbA1c was 4.9%) - we discussed about taking the thyroid hormone every day, with water, >30 minutes before breakfast, separated by >4 hours from acid reflux medications, calcium, iron, multivitamins. Pt. is taking it correctly. - will check thyroid tests today: TSH and fT4 -we discussed that we may need to increase the dose of LT4 back to 100 mcg daily after the results are back - If labs are abnormal, she will need to return for  repeat TFTs in 1.5 months  2.  Migraines with aura -She experienced 2 in the last month.  She describes that these are her first. -She first experienced bright lights which extended from her peripheral vision to the middle of her visual field and had a jagged pattern.  Approximately 1 hour later, she had a headache. -I advised her to stop her OCPs right away and get in touch with OB/GYN about this, since OCPs are contraindicated in the case of migraines with aura.  She may also need a referral to a neurologist.  Component     Latest Ref Rng & Units 03/24/2020  TSH     0.35 - 4.50 uIU/mL 2.35  T4,Free(Direct)     0.60 - 1.60 ng/dL 4.09  Normal TFTs >> for now, we will continue the same dose of levothyroxine that she has been taking, 75 mcg daily.  Carlus Pavlov, MD PhD [facility] Endocr[facility]

## 2020-04-14 ENCOUNTER — Ambulatory Visit: Payer: Managed Care, Other (non HMO) | Admitting: Internal Medicine

## 2020-04-27 ENCOUNTER — Ambulatory Visit: Payer: Managed Care, Other (non HMO) | Admitting: Internal Medicine

## 2020-05-16 ENCOUNTER — Other Ambulatory Visit: Payer: Self-pay | Admitting: Rheumatology

## 2020-05-16 DIAGNOSIS — R768 Other specified abnormal immunological findings in serum: Secondary | ICD-10-CM

## 2020-05-19 ENCOUNTER — Other Ambulatory Visit: Payer: Self-pay | Admitting: *Deleted

## 2020-05-19 DIAGNOSIS — Z79899 Other long term (current) drug therapy: Secondary | ICD-10-CM

## 2020-05-19 DIAGNOSIS — M359 Systemic involvement of connective tissue, unspecified: Secondary | ICD-10-CM

## 2020-05-19 NOTE — Telephone Encounter (Signed)
Patient advised that she will be due to update PLQ eye exam in October 2021.

## 2020-05-19 NOTE — Telephone Encounter (Signed)
Please notify the patient that she will be due to update PLQ eye exam in October 2021.

## 2020-05-19 NOTE — Telephone Encounter (Signed)
Last Visit: 03/23/2020 Next Visit: 08/24/2020 Labs: 02/17/2020 LFTs are borderline elevated. RBC count and hematocrit are borderline elevated but stable. We will continue to monitor. Rest of CBC WNL. Eye exam: 06/25/2019 normal.  Current Dose per office note 03/23/2020: Plaquenil 200 mg 1 tablet twice daily DX: Autoimmune disease   Okay to refill Plaquenil?

## 2020-05-20 ENCOUNTER — Telehealth: Payer: Self-pay | Admitting: *Deleted

## 2020-05-20 LAB — COMPLETE METABOLIC PANEL WITH GFR
AG Ratio: 1.5 (calc) (ref 1.0–2.5)
ALT: 21 U/L (ref 6–29)
AST: 24 U/L (ref 10–30)
Albumin: 4.5 g/dL (ref 3.6–5.1)
Alkaline phosphatase (APISO): 44 U/L (ref 31–125)
BUN: 7 mg/dL (ref 7–25)
CO2: 27 mmol/L (ref 20–32)
Calcium: 10.2 mg/dL (ref 8.6–10.2)
Chloride: 103 mmol/L (ref 98–110)
Creat: 0.81 mg/dL (ref 0.50–1.10)
GFR, Est African American: 118 mL/min/{1.73_m2} (ref >=60)
GFR, Est Non African American: 102 mL/min/{1.73_m2} (ref >=60)
Globulin: 3 g/dL (calc) (ref 1.9–3.7)
Glucose, Bld: 94 mg/dL (ref 65–99)
Potassium: 4.3 mmol/L (ref 3.5–5.3)
Sodium: 139 mmol/L (ref 135–146)
Total Bilirubin: 0.6 mg/dL (ref 0.2–1.2)
Total Protein: 7.5 g/dL (ref 6.1–8.1)

## 2020-05-20 LAB — CBC WITH DIFFERENTIAL/PLATELET
Absolute Monocytes: 431 {cells}/uL (ref 200–950)
Basophils Absolute: 30 {cells}/uL (ref 0–200)
Basophils Relative: 0.5 %
Eosinophils Absolute: 41 {cells}/uL (ref 15–500)
Eosinophils Relative: 0.7 %
HCT: 45.4 % — ABNORMAL HIGH (ref 35.0–45.0)
Hemoglobin: 15.5 g/dL (ref 11.7–15.5)
Lymphs Abs: 1746 {cells}/uL (ref 850–3900)
MCH: 30.2 pg (ref 27.0–33.0)
MCHC: 34.1 g/dL (ref 32.0–36.0)
MCV: 88.5 fL (ref 80.0–100.0)
MPV: 9.3 fL (ref 7.5–12.5)
Monocytes Relative: 7.3 %
Neutro Abs: 3652 {cells}/uL (ref 1500–7800)
Neutrophils Relative %: 61.9 %
Platelets: 252 10*3/uL (ref 140–400)
RBC: 5.13 Million/uL — ABNORMAL HIGH (ref 3.80–5.10)
RDW: 12.3 % (ref 11.0–15.0)
Total Lymphocyte: 29.6 %
WBC: 5.9 10*3/uL (ref 3.8–10.8)

## 2020-05-20 LAB — URINALYSIS, ROUTINE W REFLEX MICROSCOPIC
Bilirubin Urine: NEGATIVE
Glucose, UA: NEGATIVE
Hgb urine dipstick: NEGATIVE
Ketones, ur: NEGATIVE
Leukocytes,Ua: NEGATIVE
Nitrite: NEGATIVE
Protein, ur: NEGATIVE
Specific Gravity, Urine: 1.003 (ref 1.001–1.03)
pH: 7.5 (ref 5.0–8.0)

## 2020-05-20 LAB — ANTI-DNA ANTIBODY, DOUBLE-STRANDED: ds DNA Ab: 37 IU/mL — ABNORMAL HIGH

## 2020-05-20 LAB — C3 AND C4
C3 Complement: 154 mg/dL (ref 83–193)
C4 Complement: 30 mg/dL (ref 15–57)

## 2020-05-20 LAB — SEDIMENTATION RATE: Sed Rate: 11 mm/h (ref 0–20)

## 2020-05-20 NOTE — Progress Notes (Signed)
DsDNA is positive and has trended up slightly.  Complements and ESR WNL.  Please clarify if she is having any signs or symptoms of a flare.  Recommend rechecking lab work in 3 months.

## 2020-05-20 NOTE — Progress Notes (Signed)
UA normal.  RBC count and Hct are borderline elevated but trending down.  Rest of CBC WNL.  CMP WNL.  ESR WNL.

## 2020-05-20 NOTE — Telephone Encounter (Signed)
Patient would like to know if she may drink small amounts of alcohol. (Example 1 glass of wine per month.) Patient would like know if this will be okay while on PLQ and if so what time of day would be best with the medication. Please advise

## 2020-05-20 NOTE — Telephone Encounter (Signed)
There is no contraindication to drinking alcohol while on Plaquenil.  That being said she has a history of elevated LFTs.  She was referred to Dr. Loreta Ave for further evaluation, and she has had regular lab work drawn to monitor her liver functions.  We recommend the avoidance of alcohol use. Please advised the patient to also check with Dr. Loreta Ave as well.

## 2020-05-20 NOTE — Telephone Encounter (Signed)
Patient advised there is no contraindication to drinking alcohol while on Plaquenil.  That being said she has a history of elevated LFTs.  She was referred to Dr. Loreta Ave for further evaluation, and she has had regular lab work drawn to monitor her liver functions. Patient advised we recommend the avoidance of alcohol use. Patient advised to also check with Dr. Loreta Ave as well.

## 2020-06-22 ENCOUNTER — Encounter: Payer: Self-pay | Admitting: Rheumatology

## 2020-08-10 NOTE — Progress Notes (Signed)
Office Visit Note  Patient: Ariana Scott             Date of Birth: February 28, 1996           MRN: [id number]             PCP: Mancel Bale, PA-C Referring: Mancel Bale, PA-C Visit Date: 08/24/2020 Occupation: _0 @  Subjective:  Medication monitoring   History of Present Illness: Ariana Scott is a 24 y.o. female with history of autoimmune disease. She is taking plaquenil 200 mg 1 tablet by mouth daily.  She is tolerating PLQ without any side effects.   She denies any signs or symptoms of a flare recently.  She states she continues to experience bilateral SI joint pain intermittently.  She reports that about 4 days ago she started to experience pain in her left great toe but the discomfort and inflammation resolved today.  She denies any recent rashes.  She has not had any symptoms of Raynaud's.  She denies any oral nasal ulcerations.  She denies any sicca symptoms.  She denies any increased hair loss. She denies any increased fatigue.  She has been sleeping well at night. She denies any new concerns or questions. She denies any recent infections.     Activities of Daily Living:  Patient reports morning stiffness for 0 minutes.   Patient Reports nocturnal pain.  Difficulty dressing/grooming: Denies Difficulty climbing stairs: Denies Difficulty getting out of chair: Denies Difficulty using hands for taps, buttons, cutlery, and/or writing: Denies  Review of Systems  Constitutional: Negative for fatigue.  HENT: Negative for mouth sores, mouth dryness and nose dryness.   Eyes: Negative for pain, itching, visual disturbance and dryness.  Respiratory: Negative for cough, hemoptysis, shortness of breath and difficulty breathing.   Cardiovascular: Negative for chest pain, palpitations, hypertension and swelling in legs/feet.  Gastrointestinal: Negative for blood in stool, constipation and diarrhea.  Endocrine: Positive for excessive thirst and increased urination.  Genitourinary:  Negative for difficulty urinating and painful urination.  Musculoskeletal: Positive for arthralgias, joint pain, joint swelling, myalgias, muscle tenderness and myalgias. Negative for muscle weakness and morning stiffness.  Skin: Positive for rash (Eczema on palms) and redness (Facial redness). Negative for color change, pallor, hair loss, nodules/bumps, skin tightness, ulcers and sensitivity to sunlight.  Allergic/Immunologic: Negative for susceptible to infections.  Neurological: Negative for dizziness, numbness, headaches and weakness.  Hematological: Negative for bruising/bleeding tendency and swollen glands.  Psychiatric/Behavioral: Negative for depressed mood, confusion and sleep disturbance. The patient is not nervous/anxious.     PMFS History:  Patient Active Problem List   Diagnosis Date Noted  . Hypothyroidism due to Hashimoto's thyroiditis 03/24/2020  . Chronic midline low back pain without sciatica 07/08/2019  . Elevated LFTs 07/08/2019  . Fatty liver 07/08/2019  . Autoimmune disease ([place]) 07/08/2019  . Anxiety 05/01/2019  . Lichen planus 28/78/6767  . Other acne 05/01/2019  . Hypothyroidism (acquired) 01/24/2019    Past Medical History:  Diagnosis Date  . Autoimmune disease ([place])   . Eczema   . Erosive lichen planus of vulva   . Hashimoto's disease   . Hypothyroidism   . IBS (irritable bowel syndrome)   . Polycystic ovarian syndrome     Family History  Problem Relation Age of Onset  . Diabetes Mother   . Depression Mother   . ADD / ADHD Mother   . Depression Father   . Vitamin D deficiency Father   . Colitis Father   .  Hypertension Father   . High Cholesterol Father   . ADD / ADHD Brother    Past Surgical History:  Procedure Laterality Date  . COLONOSCOPY  2020   Dr. Collene Mares  . UPPER GASTROINTESTINAL ENDOSCOPY  2020   Dr. Collene Mares  . WISDOM TOOTH EXTRACTION  2016   Social History   Social History Narrative  . Not on file   Immunization History   Administered Date(s) Administered  . DTaP 10/16/1995, 11/29/1995, 01/11/1996, 01/10/1997, 04/28/2000  . HPV Quadrivalent 05/21/2007, 07/03/2008  . Hepatitis A 05/21/2007, 07/03/2008  . Hepatitis B 17-Jun-1995, 08/01/1995, 01/11/1996  . HiB (PRP-OMP) 10/16/1995, 11/29/1995, 01/11/1996, 01/10/1997  . IPV 10/16/1995, 11/29/1995, 01/11/1996, 04/28/2000  . MMR 07/02/1996, 04/28/2000  . Meningococcal Conjugate 05/21/2007  . PFIZER SARS-COV-2 Vaccination 12/07/2019, 12/31/2019, 08/18/2020  . Tdap 04/23/2007     Objective: Vital Signs: BP 120/79 (BP Location: Right Arm, Patient Position: Sitting, Cuff Size: Normal)   Pulse 74   Resp 16   Ht _0  (1.626 m)   Wt 210 lb 12.8 oz (95.6 kg)   BMI 36.18 kg/m    Physical Exam Vitals and nursing note reviewed.  Constitutional:      Appearance: She is well-developed and well-nourished.  HENT:     Head: Normocephalic and atraumatic.  Eyes:     Extraocular Movements: EOM normal.     Conjunctiva/sclera: Conjunctivae normal.  Cardiovascular:     Pulses: Intact distal pulses.  Pulmonary:     Effort: Pulmonary effort is normal.  Abdominal:     Palpations: Abdomen is soft.  Musculoskeletal:     Cervical back: Normal range of motion.  Skin:    General: Skin is warm and dry.     Capillary Refill: Capillary refill takes less than 2 seconds.     Comments: Diffuse facial erythema noted.   Palmar erythema with mild crusting and scaling consistent with eczema  Neurological:     Mental Status: She is alert and oriented to person, place, and time.  Psychiatric:        Mood and Affect: Mood and affect normal.        Behavior: Behavior normal.      Musculoskeletal Exam: C-spine, thoracic spine, lumbar spine have good range of motion with no discomfort.  No midline spinal tenderness.  No SI joint tenderness.  Shoulder joints, elbow joints, wrist joints, MCPs, PIPs, DIPs have good range of motion with no synovitis.  She is able to make a complete  fist bilaterally.  Hip joints have good range of motion with no discomfort.  Knee joints have good range of motion with no warmth or effusion.  Ankle joints have good range of motion with no tenderness or inflammation.  No tenderness of MTP joints or PIP joints.    CDAI Exam: CDAI Score: -- Patient Global: --; Provider Global: -- Swollen: --; Tender: -- Joint Exam 08/24/2020   No joint exam has been documented for this visit   There is currently no information documented on the homunculus. Go to the Rheumatology activity and complete the homunculus joint exam.  Investigation: No additional findings.  Imaging: No results found.  Recent Labs: Lab Results  Component Value Date   WBC 5.9 05/19/2020   HGB 15.5 05/19/2020   PLT 252 05/19/2020   NA 139 05/19/2020   K 4.3 05/19/2020   CL 103 05/19/2020   CO2 27 05/19/2020   GLUCOSE 94 05/19/2020   BUN 7 05/19/2020   CREATININE 0.81 05/19/2020   BILITOT  0.6 05/19/2020   AST 24 05/19/2020   ALT 21 05/19/2020   PROT 7.5 05/19/2020   CALCIUM 10.2 05/19/2020   GFRAA 118 05/19/2020    Speciality Comments: PLQ eye exam: 06/25/2019 normal. [facility]. Follow up in 6 months.  Procedures:  No procedures performed Allergies: Kiwi extract and Pineapple   Assessment / Plan:     Visit Diagnoses: Autoimmune disease ([place]) - Positive ANA, double-stranded DNA, positive Ro, history of photosensitivity, facial rash and elevated LFTs: She has not had any signs or symptoms of a systemic autoimmune disease flare recently. She is clinically doing well on Plaquenil 200 mg 1 tablet by mouth twice daily. She is tolerating Plaquenil without any side effects. She has no joint tenderness or synovitis on examination today. She continues to experience chronic pain in both SI joints. She has not had any recent rashes, symptoms of Raynaud's, oral or nasal ulcerations, symptoms, increased fatigue, chest pain, or shortness of breath. Lab work from 05/19/2020 was  reviewed with the patient today in the office: Double-stranded DNA 37, complements within normal limits, ESR within normal limits. We will repeat the following autoimmune lab work today. Orders were released. She will continue taking Plaquenil 200 mg 1 tablet by mouth twice daily. She does not need a refill at this time. She was advised to notify us if she develops any new or worsening symptoms. She will follow-up in the office in 5 months.- Plan: CBC with Differential/Platelet, COMPLETE METABOLIC PANEL WITH GFR, Urinalysis, Routine w reflex microscopic, C3 and C4, Anti-DNA antibody, double-stranded, Sedimentation rate, ANA  High risk medication use - Plaquenil 200 mg 1 tablet by mouth twice daily started in September 2020. PLQ eye exam: 06/25/2019. She recently updated PLQ eye exam at [facility].  We will call to obtain records. CBC and CMP were updated on 05/19/2020. CBC and CMP will be drawn today to monitor for drug toxicity. - Plan: CBC with Differential/Platelet, COMPLETE METABOLIC PANEL WITH GFR  Elevated CK: CK WNL-98 on 11/25/19.  She is not experiencing any muscle tenderness or weakness at this time.   Elevated LFTs - LFTs WNL on 05/19/20. She has no longer been following up with Dr. Collene Mares. 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 No tenderness palpation on examination today. She was encouraged to perform stretching exercises on a daily basis.  Eczema of both hands: She uses clobetasol cream topically as needed.   Chronic midline low back pain without sciatica: No midline spinal tenderness at this time. No symptoms of sciatica.  Other fatigue: She is not having any increased fatigue at this time. She has been sleeping well at night.   Other medical conditions are listed as follows:   Fatty liver  Lichen planus  History of hypothyroidism  Orders: Orders Placed This Encounter  Procedures  . CBC with  Differential/Platelet  . COMPLETE METABOLIC PANEL WITH GFR  . Urinalysis, Routine w reflex microscopic  . C3 and C4  . Anti-DNA antibody, double-stranded  . Sedimentation rate  . ANA   No orders of the defined types were placed in this encounter.   Follow-Up Instructions: Return in about 5 months (around 01/22/2021) for Autoimmune Disease.   Ofilia Neas, PA-C  Note - This record has been created using Dragon software.  Chart creation errors have been sought, but may not always  have been located. Such creation errors do not reflect on  the standard  of medical care.

## 2020-08-12 ENCOUNTER — Other Ambulatory Visit: Payer: Self-pay | Admitting: Physician Assistant

## 2020-08-12 DIAGNOSIS — R768 Other specified abnormal immunological findings in serum: Secondary | ICD-10-CM

## 2020-08-12 NOTE — Telephone Encounter (Signed)
Last Visit: 03/23/2020 Next Visit: 08/24/2020 Labs: 02/17/2020 LFTs are borderline elevated. RBC count and hematocrit are borderline elevated but stable. We will continue to monitor. Rest of CBC WNL. Eye exam: 06/25/2019 normal.  Current Dose per office note 03/23/2020: Plaquenil 200 mg 1 tablet twice daily DX: Autoimmune disease   Left message to advise patient she is due to update her PLQ eye exam.  Okay to refill PLQ?

## 2020-08-24 ENCOUNTER — Encounter: Payer: Self-pay | Admitting: Physician Assistant

## 2020-08-24 ENCOUNTER — Ambulatory Visit: Payer: Managed Care, Other (non HMO) | Admitting: Physician Assistant

## 2020-08-24 ENCOUNTER — Other Ambulatory Visit: Payer: Self-pay

## 2020-08-24 VITALS — BP 120/79 | HR 74 | Resp 16 | Ht 64.0 in | Wt 210.8 lb

## 2020-08-24 DIAGNOSIS — R7989 Other specified abnormal findings of blood chemistry: Secondary | ICD-10-CM | POA: Diagnosis not present

## 2020-08-24 DIAGNOSIS — M359 Systemic involvement of connective tissue, unspecified: Secondary | ICD-10-CM

## 2020-08-24 DIAGNOSIS — K760 Fatty (change of) liver, not elsewhere classified: Secondary | ICD-10-CM

## 2020-08-24 DIAGNOSIS — G8929 Other chronic pain: Secondary | ICD-10-CM

## 2020-08-24 DIAGNOSIS — R748 Abnormal levels of other serum enzymes: Secondary | ICD-10-CM | POA: Diagnosis not present

## 2020-08-24 DIAGNOSIS — L309 Dermatitis, unspecified: Secondary | ICD-10-CM

## 2020-08-24 DIAGNOSIS — Z8639 Personal history of other endocrine, nutritional and metabolic disease: Secondary | ICD-10-CM

## 2020-08-24 DIAGNOSIS — L439 Lichen planus, unspecified: Secondary | ICD-10-CM

## 2020-08-24 DIAGNOSIS — Z79899 Other long term (current) drug therapy: Secondary | ICD-10-CM

## 2020-08-24 DIAGNOSIS — R5383 Other fatigue: Secondary | ICD-10-CM

## 2020-08-24 DIAGNOSIS — M533 Sacrococcygeal disorders, not elsewhere classified: Secondary | ICD-10-CM

## 2020-08-24 DIAGNOSIS — M545 Low back pain, unspecified: Secondary | ICD-10-CM

## 2020-08-25 NOTE — Progress Notes (Signed)
dsDNA is positive but stable. No change in therapy recommended at this time.

## 2020-08-25 NOTE — Progress Notes (Signed)
RBC count and hematocrit are borderline elevated but stable. Rest of CBC WNL.  CMP WNL.  ESR and complements WNL.  UA normal.

## 2020-08-26 LAB — ANTI-NUCLEAR AB-TITER (ANA TITER)
ANA TITER: 1:40 {titer} — ABNORMAL HIGH
ANA Titer 1: 1:80 {titer} — ABNORMAL HIGH

## 2020-08-26 LAB — COMPLETE METABOLIC PANEL WITH GFR
AG Ratio: 1.4 (calc) (ref 1.0–2.5)
ALT: 16 U/L (ref 6–29)
AST: 20 U/L (ref 10–30)
Albumin: 4.4 g/dL (ref 3.6–5.1)
Alkaline phosphatase (APISO): 45 U/L (ref 31–125)
BUN: 7 mg/dL (ref 7–25)
CO2: 24 mmol/L (ref 20–32)
Calcium: 9.9 mg/dL (ref 8.6–10.2)
Chloride: 103 mmol/L (ref 98–110)
Creat: 0.76 mg/dL (ref 0.50–1.10)
GFR, Est African American: 126 mL/min/{1.73_m2} (ref >=60)
GFR, Est Non African American: 109 mL/min/{1.73_m2} (ref >=60)
Globulin: 3.2 g/dL (calc) (ref 1.9–3.7)
Glucose, Bld: 87 mg/dL (ref 65–99)
Potassium: 3.9 mmol/L (ref 3.5–5.3)
Sodium: 139 mmol/L (ref 135–146)
Total Bilirubin: 0.4 mg/dL (ref 0.2–1.2)
Total Protein: 7.6 g/dL (ref 6.1–8.1)

## 2020-08-26 LAB — CBC WITH DIFFERENTIAL/PLATELET
Absolute Monocytes: 552 cells/uL (ref 200–950)
Basophils Absolute: 28 cells/uL (ref 0–200)
Basophils Relative: 0.4 %
Eosinophils Absolute: 48 cells/uL (ref 15–500)
Eosinophils Relative: 0.7 %
HCT: 45.3 % — ABNORMAL HIGH (ref 35.0–45.0)
Hemoglobin: 15.5 g/dL (ref 11.7–15.5)
Lymphs Abs: 2167 cells/uL (ref 850–3900)
MCH: 29.6 pg (ref 27.0–33.0)
MCHC: 34.2 g/dL (ref 32.0–36.0)
MCV: 86.6 fL (ref 80.0–100.0)
MPV: 9.2 fL (ref 7.5–12.5)
Monocytes Relative: 8 %
Neutro Abs: 4106 cells/uL (ref 1500–7800)
Neutrophils Relative %: 59.5 %
Platelets: 264 10*3/uL (ref 140–400)
RBC: 5.23 10*6/uL — ABNORMAL HIGH (ref 3.80–5.10)
RDW: 12.3 % (ref 11.0–15.0)
Total Lymphocyte: 31.4 %
WBC: 6.9 10*3/uL (ref 3.8–10.8)

## 2020-08-26 LAB — URINALYSIS, ROUTINE W REFLEX MICROSCOPIC
Bilirubin Urine: NEGATIVE
Glucose, UA: NEGATIVE
Hgb urine dipstick: NEGATIVE
Ketones, ur: NEGATIVE
Leukocytes,Ua: NEGATIVE
Nitrite: NEGATIVE
Protein, ur: NEGATIVE
Specific Gravity, Urine: 1.007 (ref 1.001–1.03)
pH: 6.5 (ref 5.0–8.0)

## 2020-08-26 LAB — SEDIMENTATION RATE: Sed Rate: 14 mm/h (ref 0–20)

## 2020-08-26 LAB — C3 AND C4
C3 Complement: 159 mg/dL (ref 83–193)
C4 Complement: 33 mg/dL (ref 15–57)

## 2020-08-26 LAB — ANTI-DNA ANTIBODY, DOUBLE-STRANDED: ds DNA Ab: 35 IU/mL — ABNORMAL HIGH

## 2020-08-26 LAB — ANA: Anti Nuclear Antibody (ANA): POSITIVE — AB

## 2020-08-27 NOTE — Progress Notes (Signed)
ANA remains positive, low titer.  No change in therapy recommended at this time.  We will continue to monitor lab work every 3 months.

## 2020-09-29 ENCOUNTER — Ambulatory Visit: Payer: Managed Care, Other (non HMO) | Admitting: Internal Medicine

## 2020-10-02 ENCOUNTER — Other Ambulatory Visit: Payer: Self-pay

## 2020-10-06 ENCOUNTER — Ambulatory Visit: Payer: Managed Care, Other (non HMO) | Admitting: Internal Medicine

## 2020-10-06 ENCOUNTER — Encounter: Payer: Self-pay | Admitting: Internal Medicine

## 2020-10-06 ENCOUNTER — Other Ambulatory Visit: Payer: Self-pay

## 2020-10-06 VITALS — BP 120/82 | HR 96 | Ht 64.0 in | Wt 212.6 lb

## 2020-10-06 DIAGNOSIS — E038 Other specified hypothyroidism: Secondary | ICD-10-CM | POA: Diagnosis not present

## 2020-10-06 DIAGNOSIS — E063 Autoimmune thyroiditis: Secondary | ICD-10-CM | POA: Diagnosis not present

## 2020-10-06 LAB — TSH: TSH: 1.5 u[IU]/mL (ref 0.35–4.50)

## 2020-10-06 LAB — T4, FREE: Free T4: 0.9 ng/dL (ref 0.60–1.60)

## 2020-10-06 NOTE — Progress Notes (Signed)
Patient ID: Ariana Scott, female   DOB: 03-Jun-1996, 25 y.o.   MRN: [id number]   This visit occurred during the SARS-CoV-2 public health emergency.  Safety protocols were in place, including screening questions prior to the visit, additional usage of staff PPE, and extensive cleaning of exam room while observing appropriate contact time as indicated for disinfecting solutions.   HPI  Ariana Scott is a 25 y.o.-year-old female, initially referred by Ariana Scott, returning for follow-up for Hashimoto's hypothyroidism.  Last visit 6 months ago.  She had more stress since last OV and more anxiety.  Reviewed and addended history: Pt. was diagnosed with hypothyroidism in 11/2018 during investigation for IBS by Ariana Scott.    Patient has had constipation for years, but she then started to have loose stools (for 4 years) and was sent to GI.  Ariana Scott checked her TSH which returned very high, at 42.35.   She was started on levothyroxine 75 mcg daily and referred to endocrinology.  In the past, she was taking 52-59 mcg levothyroxine daily, but was having palpitations so we switched to 50 mcg tablets, which are white.  She tolerates this well.  Pt is on levothyroxine 50 +25 mcg daily, taken: - in am - fasting - at least 30 min-1h from b'fast - no calcium - no iron - no multivitamins - no PPIs - not on Biotin  I reviewed patient's TFTs: Lab Results  Component Value Date   TSH 2.35 03/24/2020   TSH 2.58 11/11/2019   TSH 4.19 09/16/2019   TSH 6.97 (H) 07/23/2019   TSH 5.72 (H) 06/18/2019   TSH 85.26 (H) 04/01/2019   TSH 1.38 01/24/2019   FREET4 0.90 03/24/2020   FREET4 0.91 11/11/2019   FREET4 0.91 09/16/2019   FREET4 0.82 07/23/2019   FREET4 0.82 06/18/2019   FREET4 0.46 (L) 04/01/2019   FREET4 0.97 01/24/2019  12/11/2018: TSH 42.35  Her antithyroid antibodies are high, pointing towards a diagnosis of Hashimoto's thyroiditis Component     Latest Ref Rng & Units 01/24/2019   Thyroperoxidase Ab SerPl-aCnc     <9 IU/mL 395 (H)  Thyroglobulin Ab     < or = 1 IU/mL 2 (H)   She previously had palpitations, which improved after changing levothyroxine tablets twice ones (50 mcg).  Pt denies: - feeling nodules in neck - hoarseness - dysphagia - choking - SOB with lying down  She has + FH of thyroid disorders in: PGGF. No FH of thyroid cancer. No h/o radiation tx to head or neck.  No seaweed or kelp. No recent contrast studies. No herbal supplements. No Biotin use. No recent steroids use.   She also has a history of trochanteric bursitis.  She describes that had 2 migraines with visual aura in the months prior to our last visit.due to her PCOS, she has been on OCPs for years for her amenorrhea/irregular menses. She stopped OCPs since our last OV, 2/2 migraines with aura.  Afterwards, she did not have any more migraines.  Also, her menstrual cycles are now almost normal.  She was also found to have elevated LFTs at recent investigation by Ariana Scott.  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  She had a liver ultrasound >> fatty liver.   ROS: Constitutional: no weight gain/no weight loss, no fatigue, no subjective hyperthermia, no subjective hypothermia Eyes: no blurry vision, no xerophthalmia ENT: no sore throat, + see HPI Cardiovascular: no CP/no SOB/no  palpitations/no leg swelling Respiratory: no cough/no SOB/no wheezing Gastrointestinal: no N/no V/no D/no C/no acid reflux Musculoskeletal: no muscle aches/no joint aches Skin: no rashes, no hair loss Neurological: no tremors/no numbness/no tingling/no dizziness  I reviewed pt's medications, allergies, PMH, social hx, family hx, and changes were documented in the history of present illness. Otherwise, unchanged from my initial visit note.  Past Medical History:  Diagnosis Date  . Autoimmune disease (HCC)   . Eczema   . Erosive lichen planus of vulva   . Hashimoto's  disease   . Hypothyroidism   . IBS (irritable bowel syndrome)   . Polycystic ovarian syndrome    Past Surgical History:  Procedure Laterality Date  . COLONOSCOPY  2020   Ariana Scott  . UPPER GASTROINTESTINAL ENDOSCOPY  2020   Ariana Scott  . WISDOM TOOTH EXTRACTION  2016   Social History   Socioeconomic History  . Marital status: Single    Spouse name: Not on file  . Number of children: 0  . Years of education: Not on file  . Highest education level: Not on file  Occupational History  .  Recent college graduate, unemployed  Social Needs  . Financial resource strain: Not on file  . Food insecurity:    Worry: Not on file    Inability: Not on file  . Transportation needs:    Medical: Not on file    Non-medical: Not on file  Tobacco Use  . Smoking status: Never Smoker  . Smokeless tobacco: Never Used  Substance and Sexual Activity  . Alcohol use:  1 glass of wine,  . Drug use: No   Current Outpatient Medications on File Prior to Visit  Medication Sig Dispense Refill  . Azelaic Acid 15 % cream Apply on the entire face daily after cleansing    . clobetasol ointment (TEMOVATE) 0.05 % daily as needed.    . hydroxychloroquine (PLAQUENIL) 200 MG tablet TAKE 1 TABLET BY MOUTH TWICE A DAY 180 tablet 0  . levothyroxine (SYNTHROID) 50 MCG tablet 1.5 tab daily.     No current facility-administered medications on file prior to visit.   Allergies  Allergen Reactions  . Kiwi Extract Itching  . Pineapple Itching   Family history: -Mother, aunt with diabetes -Father with HTN and HL -Cancer in paternal grandmother: Breast and uterus + See HPI  PE: BP 120/82   Pulse 96   Ht 5\' 4"  (1.626 m)   Wt 212 lb 9.6 oz (96.4 kg)   SpO2 97%   BMI 36.49 kg/m  Wt Readings from Last 3 Encounters:  10/06/20 212 lb 9.6 oz (96.4 kg)  08/24/20 210 lb 12.8 oz (95.6 kg)  03/24/20 209 lb (94.8 kg)   Constitutional: overweight, in NAD Eyes: PERRLA, EOMI, no exophthalmos ENT: moist mucous  membranes, no thyromegaly, no cervical lymphadenopathy Cardiovascular: tachycardia, RR, No MRG Respiratory: CTA B Gastrointestinal: abdomen soft, NT, ND, BS+ Musculoskeletal: no deformities, strength intact in all 4 Skin: moist, warm, no rashes Neurological: no tremor with outstretched hands, DTR normal in all 4  ASSESSMENT: 1. Hypothyroidism -Due to Hashimoto's thyroiditis  PLAN:  1. Patient with history of acquired hypothyroidism, with positive antithyroid antibodies.  She initially had symptoms consistent with hypothyroidism: Weight gain, dry skin, change in bowel habits, but also anxiety, tachycardia, tremors.  She takes anxiolytics as needed.  She felt better after starting levothyroxine, with less fatigue.  She initially had headaches, but these resolved, however, she had 2 migraines before last visit.  She also had palpitations which resolved after switching to white levothyroxine tablets.  At last visit, however, she was on 75 mcg levothyroxine daily due to a misunderstanding with the pharmacy.  However, her TSH was normal so we continued this dose - latest thyroid labs reviewed with pt >> normal: Lab Results  Component Value Date   TSH 2.35 03/24/2020   - she continues on LT4 75 mcg daily - pt feels good on this dose, except for anxiety, which she feels is related to stress - we discussed about taking the thyroid hormone every day, with water, >30 minutes before breakfast, separated by >4 hours from acid reflux medications, calcium, iron, multivitamins. Pt. is taking it correctly. - will check thyroid tests today: TSH and fT4 - If labs are abnormal, she will need to return for repeat TFTs in 1.5 months - OTW, I will see her back in a year  Needs refills (hold until pt needs it).  Component     Latest Ref Rng & Units 10/06/2020  TSH     0.35 - 4.50 uIU/mL 1.50  T4,Free(Direct)     0.60 - 1.60 ng/dL 5.10  Normal TFTs.  Carlus Pavlov, MD PhD [facility]

## 2020-10-06 NOTE — Patient Instructions (Addendum)
Please continue: - Levothyroxine 75 mcg every day  Take the thyroid hormone every day, with water, at least 30 minutes before breakfast, separated by at least 4 hours from: - acid reflux medications - calcium - iron - multivitamins  Please come back for a follow-up appointment in 1 year.

## 2020-10-07 MED ORDER — LEVOTHYROXINE SODIUM 50 MCG PO TABS: 75.0000 ug | ORAL_TABLET | Freq: Every day | ORAL | 3 refills | Status: DC

## 2020-11-07 ENCOUNTER — Other Ambulatory Visit: Payer: Self-pay | Admitting: Rheumatology

## 2020-11-07 DIAGNOSIS — R768 Other specified abnormal immunological findings in serum: Secondary | ICD-10-CM

## 2020-11-09 NOTE — Telephone Encounter (Signed)
Last Visit: 08/24/2020 Next Visit: 01/18/2021 Labs: 08/24/2020, RBC count and hematocrit are borderline elevated but stable. Rest of CBC WNL. CMP WNL. ESR and complements WNL. UA normal, dsDNA is positive but stable. No change in therapy recommended at this time, ANA remains positive, low titer. No change in therapy recommended at this time.  We will continue to monitor lab work every 3 months.  Eye exam: 06/23/2020 WNL  Current Dose per office note 08/24/2020, Plaquenil 200 mg 1 tablet by mouth twice daily  DX: Autoimmune disease   Last Fill: 08/12/2020  Okay to refill Plaquenil?

## 2020-11-23 ENCOUNTER — Other Ambulatory Visit: Payer: Self-pay | Admitting: *Deleted

## 2020-11-23 DIAGNOSIS — Z79899 Other long term (current) drug therapy: Secondary | ICD-10-CM

## 2020-11-23 DIAGNOSIS — M359 Systemic involvement of connective tissue, unspecified: Secondary | ICD-10-CM

## 2020-11-24 LAB — COMPLETE METABOLIC PANEL WITH GFR
AG Ratio: 1.5 (calc) (ref 1.0–2.5)
ALT: 18 U/L (ref 6–29)
AST: 22 U/L (ref 10–30)
Albumin: 4.6 g/dL (ref 3.6–5.1)
Alkaline phosphatase (APISO): 44 U/L (ref 31–125)
BUN/Creatinine Ratio: 8 (calc) (ref 6–22)
BUN: 6 mg/dL — ABNORMAL LOW (ref 7–25)
CO2: 28 mmol/L (ref 20–32)
Calcium: 9.9 mg/dL (ref 8.6–10.2)
Chloride: 102 mmol/L (ref 98–110)
Creat: 0.72 mg/dL (ref 0.50–1.10)
GFR, Est African American: 135 mL/min/{1.73_m2} (ref >=60)
GFR, Est Non African American: 116 mL/min/{1.73_m2} (ref >=60)
Globulin: 3.1 g/dL (calc) (ref 1.9–3.7)
Glucose, Bld: 93 mg/dL (ref 65–99)
Potassium: 4.6 mmol/L (ref 3.5–5.3)
Sodium: 138 mmol/L (ref 135–146)
Total Bilirubin: 0.4 mg/dL (ref 0.2–1.2)
Total Protein: 7.7 g/dL (ref 6.1–8.1)

## 2020-11-24 LAB — CBC WITH DIFFERENTIAL/PLATELET
Absolute Monocytes: 420 cells/uL (ref 200–950)
Basophils Absolute: 30 cells/uL (ref 0–200)
Basophils Relative: 0.5 %
Eosinophils Absolute: 48 cells/uL (ref 15–500)
Eosinophils Relative: 0.8 %
HCT: 47.1 % — ABNORMAL HIGH (ref 35.0–45.0)
Hemoglobin: 15.9 g/dL — ABNORMAL HIGH (ref 11.7–15.5)
Lymphs Abs: 1728 cells/uL (ref 850–3900)
MCH: 29.7 pg (ref 27.0–33.0)
MCHC: 33.8 g/dL (ref 32.0–36.0)
MCV: 87.9 fL (ref 80.0–100.0)
MPV: 9.7 fL (ref 7.5–12.5)
Monocytes Relative: 7 %
Neutro Abs: 3774 cells/uL (ref 1500–7800)
Neutrophils Relative %: 62.9 %
Platelets: 278 10*3/uL (ref 140–400)
RBC: 5.36 10*6/uL — ABNORMAL HIGH (ref 3.80–5.10)
RDW: 12.8 % (ref 11.0–15.0)
Total Lymphocyte: 28.8 %
WBC: 6 10*3/uL (ref 3.8–10.8)

## 2020-11-24 NOTE — Progress Notes (Signed)
BUN is borderline low-6. Rest of CMP WNL.  LFTs WNL.   RBC count, hgb, and hct are borderline elevated but stable.  We will continue to monitor.

## 2021-01-04 NOTE — Progress Notes (Signed)
Office Visit Note  Patient: Ariana Scott             Date of Birth: [birth year]           MRN: [id number]             PCP: Mancel Bale, PA-C Referring: Mancel Bale, PA-C Visit Date: 01/18/2021 Occupation: @GUAROCC @  Subjective:  Rash.   History of Present Illness: Ariana Scott is a 25 y.o. female with history of autoimmune disease.  She works at the [employer] and is exposed to the sun all day.  She states she has been getting photosensitivity and rash.  She has been using sunscreen but not frequently.  She is also developed some eczema on her hands.  She denies any joint swelling.  She has been under a lot of stress and has been going through anxiety and depression.  Activities of Daily Living:  Patient reports morning stiffness for 0 minutes.   Patient Reports nocturnal pain.  Difficulty dressing/grooming: Denies Difficulty climbing stairs: Denies Difficulty getting out of chair: Denies Difficulty using hands for taps, buttons, cutlery, and/or writing: Denies  Review of Systems  Constitutional: Positive for fatigue. Negative for night sweats, weight gain and weight loss.  HENT: Positive for mouth dryness. Negative for mouth sores, trouble swallowing, trouble swallowing and nose dryness.   Eyes: Negative for pain, redness, itching, visual disturbance and dryness.  Respiratory: Negative for cough, shortness of breath and difficulty breathing.   Cardiovascular: Negative for chest pain, palpitations, hypertension, irregular heartbeat and swelling in legs/feet.  Gastrointestinal: Negative for blood in stool, constipation and diarrhea.  Endocrine: Positive for excessive thirst. Negative for increased urination.  Genitourinary: Negative for difficulty urinating and vaginal dryness.  Musculoskeletal: Positive for arthralgias, joint pain, myalgias and myalgias. Negative for joint swelling, muscle weakness, morning stiffness and muscle tenderness.  Skin: Positive for rash  and sensitivity to sunlight. Negative for color change, hair loss, redness, skin tightness and ulcers.  Allergic/Immunologic: Negative for susceptible to infections.  Neurological: Negative for dizziness, numbness, headaches, memory loss, night sweats and weakness.  Hematological: Negative for bruising/bleeding tendency and swollen glands.  Psychiatric/Behavioral: Positive for depressed mood. Negative for confusion and sleep disturbance. The patient is nervous/anxious.     PMFS History:  Patient Active Problem List   Diagnosis Date Noted  . Hypothyroidism due to Hashimoto's thyroiditis 03/24/2020  . Chronic midline low back pain without sciatica 07/08/2019  . Elevated LFTs 07/08/2019  . Fatty liver 07/08/2019  . Autoimmune disease ([place]) 07/08/2019  . Anxiety 05/01/2019  . Lichen planus 09/14/7251  . Other acne 05/01/2019  . Hypothyroidism (acquired) 01/24/2019    Past Medical History:  Diagnosis Date  . Autoimmune disease ([place])   . Eczema   . Erosive lichen planus of vulva   . Hashimoto's disease   . Hypothyroidism   . IBS (irritable bowel syndrome)   . Polycystic ovarian syndrome     Family History  Problem Relation Age of Onset  . Diabetes Mother   . Depression Mother   . ADD / ADHD Mother   . Depression Father   . Vitamin D deficiency Father   . Colitis Father   . Hypertension Father   . High Cholesterol Father   . ADD / ADHD Brother    Past Surgical History:  Procedure Laterality Date  . COLONOSCOPY  2020   Dr. Collene Mares  . UPPER GASTROINTESTINAL ENDOSCOPY  2020   Dr. Collene Mares  . WISDOM TOOTH  EXTRACTION  2016   Social History   Social History Narrative  . Not on file   Immunization History  Administered Date(s) Administered  . DTaP 10/16/1995, 11/29/1995, 01/11/1996, 01/10/1997, 04/28/2000  . HPV Quadrivalent 05/21/2007, 07/03/2008  . Hepatitis A 05/21/2007, 07/03/2008  . Hepatitis B 10-09-1994, 08/01/1995, 01/11/1996  . HiB (PRP-OMP) 10/16/1995, 11/29/1995,  01/11/1996, 01/10/1997  . IPV 10/16/1995, 11/29/1995, 01/11/1996, 04/28/2000  . MMR 07/02/1996, 04/28/2000  . Meningococcal Conjugate 05/21/2007  . PFIZER(Purple Top)SARS-COV-2 Vaccination 12/07/2019, 12/31/2019, 08/18/2020  . Tdap 04/23/2007     Objective: Vital Signs: BP 129/90 (BP Location: Left Arm, Patient Position: Sitting, Cuff Size: Large)   Pulse 78   Resp 16   Ht 5' 4.5" (1.638 m)   Wt 211 lb 6.4 oz (95.9 kg)   BMI 35.73 kg/m    Physical Exam Vitals and nursing note reviewed.  Constitutional:      Appearance: She is well-developed.  HENT:     Head: Normocephalic and atraumatic.  Eyes:     Conjunctiva/sclera: Conjunctivae normal.  Cardiovascular:     Rate and Rhythm: Normal rate and regular rhythm.     Heart sounds: Normal heart sounds.  Pulmonary:     Effort: Pulmonary effort is normal.     Breath sounds: Normal breath sounds.  Abdominal:     General: Bowel sounds are normal.     Palpations: Abdomen is soft.  Musculoskeletal:     Cervical back: Normal range of motion.  Lymphadenopathy:     Cervical: No cervical adenopathy.  Skin:    General: Skin is warm and dry.     Capillary Refill: Capillary refill takes less than 2 seconds.  Neurological:     Mental Status: She is alert and oriented to person, place, and time.  Psychiatric:        Behavior: Behavior normal.      Musculoskeletal Exam: C-spine thoracic and lumbar spine with good range of motion.  Shoulder joints, elbow joints, wrist joints, MCPs PIPs and DIPs with good range of motion with no synovitis.  Hip joints, knee joints, ankles, MTPs and PIPs with good range of motion with no synovitis.  CDAI Exam: CDAI Score: -- Patient Global: --; Provider Global: -- Swollen: --; Tender: -- Joint Exam 01/18/2021   No joint exam has been documented for this visit   There is currently no information documented on the homunculus. Go to the Rheumatology activity and complete the homunculus joint  exam.  Investigation: No additional findings.  Imaging: No results found.  Recent Labs: Lab Results  Component Value Date   WBC 6.0 11/23/2020   HGB 15.9 (H) 11/23/2020   PLT 278 11/23/2020   NA 138 11/23/2020   K 4.6 11/23/2020   CL 102 11/23/2020   CO2 28 11/23/2020   GLUCOSE 93 11/23/2020   BUN 6 (L) 11/23/2020   CREATININE 0.72 11/23/2020   BILITOT 0.4 11/23/2020   AST 22 11/23/2020   ALT 18 11/23/2020   PROT 7.7 11/23/2020   CALCIUM 9.9 11/23/2020   GFRAA 135 11/23/2020    Speciality Comments: PLQ eye exam: 06/23/2020 WNL. [facility]. Follow up in 12 months.  Procedures:  No procedures performed Allergies: Kiwi extract and Pineapple   Assessment / Plan:     Visit Diagnoses: Autoimmune disease ([place]) - Positive ANA, double-stranded DNA, positive Ro, history of photosensitivity, facial rash and elevated LFTs: -She has had increased photosensitivity as she has been exposed to the sun all day during the summertime.  Use of sunscreen was emphasized.  I have advised her to use sunscreen frequently throughout the day.  She has noticed improvement in her symptoms since she has been on hydroxychloroquine.  Plan: ANA, Sjogrens syndrome-A extractable nuclear antibody, C3 and C4, Sedimentation rate, Urinalysis, Routine w reflex microscopic, Anti-DNA antibody, double-stranded  High risk medication use - Plaquenil 200 mg 1 tablet by mouth twice daily started in September 2020. PLQ eye exam: 06/23/2020  Chronic SI joint pain-she has some discomfort in her lower back.  No SI joint tenderness was noted today.  Chronic midline low back pain without sciatica-chronic pain.  Eczema of both hands-she had eczema on her hands bilaterally.  She has been using some topical agents.  I advised her to follow-up with the dermatologist if symptoms persist.  Other fatigue -she has been experiencing increased fatigue.  She states she has several family members with vitamin D deficiency.  We will  check vitamin D level today.  Have also advised her to take vitamin D 2000 units daily.  In patients with lupus we like to keep vitamin D around 40-50.  Plan: VITAMIN D 25 Hydroxy (Vit-D Deficiency, Fractures)  Lichen planus  Fatty liver-her LFTs have normalized now.  History of hypothyroidism  Anxiety and depression-she has been experiencing increased anxiety and depression.  She is planning to see a psychiatrist and a psychologist.  Orders: Orders Placed This Encounter  Procedures  . ANA  . Sjogrens syndrome-A extractable nuclear antibody  . C3 and C4  . Sedimentation rate  . Urinalysis, Routine w reflex microscopic  . Anti-DNA antibody, double-stranded  . VITAMIN D 25 Hydroxy (Vit-D Deficiency, Fractures)   No orders of the defined types were placed in this encounter.    Follow-Up Instructions: Return in about 5 months (around 06/20/2021) for Autoimmune disease.   Bo Merino, MD  Note - This record has been created using Editor, commissioning.  Chart creation errors have been sought, but may not always  have been located. Such creation errors do not reflect on  the standard of medical care.

## 2021-01-18 ENCOUNTER — Ambulatory Visit (INDEPENDENT_AMBULATORY_CARE_PROVIDER_SITE_OTHER): Payer: Managed Care, Other (non HMO) | Admitting: Rheumatology

## 2021-01-18 ENCOUNTER — Encounter: Payer: Self-pay | Admitting: Rheumatology

## 2021-01-18 ENCOUNTER — Other Ambulatory Visit: Payer: Self-pay

## 2021-01-18 VITALS — BP 129/90 | HR 78 | Resp 16 | Ht 64.5 in | Wt 211.4 lb

## 2021-01-18 DIAGNOSIS — L309 Dermatitis, unspecified: Secondary | ICD-10-CM

## 2021-01-18 DIAGNOSIS — M545 Low back pain, unspecified: Secondary | ICD-10-CM | POA: Diagnosis not present

## 2021-01-18 DIAGNOSIS — Z79899 Other long term (current) drug therapy: Secondary | ICD-10-CM

## 2021-01-18 DIAGNOSIS — F419 Anxiety disorder, unspecified: Secondary | ICD-10-CM

## 2021-01-18 DIAGNOSIS — F32A Depression, unspecified: Secondary | ICD-10-CM

## 2021-01-18 DIAGNOSIS — L439 Lichen planus, unspecified: Secondary | ICD-10-CM

## 2021-01-18 DIAGNOSIS — Z8639 Personal history of other endocrine, nutritional and metabolic disease: Secondary | ICD-10-CM

## 2021-01-18 DIAGNOSIS — R5383 Other fatigue: Secondary | ICD-10-CM

## 2021-01-18 DIAGNOSIS — G8929 Other chronic pain: Secondary | ICD-10-CM

## 2021-01-18 DIAGNOSIS — M533 Sacrococcygeal disorders, not elsewhere classified: Secondary | ICD-10-CM | POA: Diagnosis not present

## 2021-01-18 DIAGNOSIS — K760 Fatty (change of) liver, not elsewhere classified: Secondary | ICD-10-CM

## 2021-01-18 DIAGNOSIS — R7989 Other specified abnormal findings of blood chemistry: Secondary | ICD-10-CM

## 2021-01-18 DIAGNOSIS — M359 Systemic involvement of connective tissue, unspecified: Secondary | ICD-10-CM | POA: Diagnosis not present

## 2021-01-18 DIAGNOSIS — R748 Abnormal levels of other serum enzymes: Secondary | ICD-10-CM

## 2021-01-19 ENCOUNTER — Other Ambulatory Visit: Payer: Self-pay | Admitting: *Deleted

## 2021-01-19 DIAGNOSIS — E559 Vitamin D deficiency, unspecified: Secondary | ICD-10-CM

## 2021-01-19 MED ORDER — VITAMIN D (ERGOCALCIFEROL) 1.25 MG (50000 UNIT) PO CAPS: 50000.0000 [IU] | ORAL_CAPSULE | ORAL | 0 refills | Status: DC

## 2021-01-19 NOTE — Progress Notes (Signed)
The titers of autoimmune antibodies including SSA and double-stranded DNA are unchanged.  Complements are normal and sed rate is normal.  Please advise patient to continue Plaquenil as prescribed.

## 2021-01-19 NOTE — Progress Notes (Signed)
Vitamin D is low.  Please send a prescription for vitamin D 50,000 units once a week for 3 months.  Repeat vitamin D in 3 months.  Patient should take over-the-counter vitamin D 2000 units daily after finishing the course of vitamin D.

## 2021-01-20 LAB — URINALYSIS, ROUTINE W REFLEX MICROSCOPIC
Bacteria, UA: NONE SEEN /HPF
Bilirubin Urine: NEGATIVE
Glucose, UA: NEGATIVE
Hyaline Cast: NONE SEEN /LPF
Ketones, ur: NEGATIVE
Leukocytes,Ua: NEGATIVE
Nitrite: NEGATIVE
Protein, ur: NEGATIVE
RBC / HPF: NONE SEEN /HPF (ref 0–2)
Specific Gravity, Urine: 1.004 (ref 1.001–1.035)
WBC, UA: NONE SEEN /HPF (ref 0–5)
pH: 6.5 (ref 5.0–8.0)

## 2021-01-20 LAB — C3 AND C4
C3 Complement: 159 mg/dL (ref 83–193)
C4 Complement: 32 mg/dL (ref 15–57)

## 2021-01-20 LAB — SEDIMENTATION RATE: Sed Rate: 17 mm/h (ref 0–20)

## 2021-01-20 LAB — ANA: Anti Nuclear Antibody (ANA): POSITIVE — AB

## 2021-01-20 LAB — MICROSCOPIC MESSAGE

## 2021-01-20 LAB — SJOGRENS SYNDROME-A EXTRACTABLE NUCLEAR ANTIBODY: SSA (Ro) (ENA) Antibody, IgG: >8 AI — AB

## 2021-01-20 LAB — VITAMIN D 25 HYDROXY (VIT D DEFICIENCY, FRACTURES): Vit D, 25-Hydroxy: 22 ng/mL — ABNORMAL LOW (ref 30–100)

## 2021-01-20 LAB — ANTI-NUCLEAR AB-TITER (ANA TITER): ANA Titer 1: 1:160 {titer} — ABNORMAL HIGH

## 2021-01-20 LAB — ANTI-DNA ANTIBODY, DOUBLE-STRANDED: ds DNA Ab: 35 IU/mL — ABNORMAL HIGH

## 2021-02-17 ENCOUNTER — Other Ambulatory Visit: Payer: Self-pay | Admitting: Physician Assistant

## 2021-02-17 DIAGNOSIS — R768 Other specified abnormal immunological findings in serum: Secondary | ICD-10-CM

## 2021-02-17 NOTE — Telephone Encounter (Signed)
Last Visit: 01/18/2021 Next Visit: 05/24/2021 Labs: 11/23/2020 BUN is borderline low-6. Rest of CMP WNL. LFTs WNL.  RBC count, hgb, and hct are borderline elevated but stable. Eye exam:  06/23/2020 WNL  Current Dose per office note 01/18/2021: Plaquenil 200 mg 1 tablet by mouth twice daily  DX: Autoimmune disease   Last Fill: 11/09/2020  Okay to refill Plaquenil?

## 2021-03-01 ENCOUNTER — Other Ambulatory Visit: Payer: Self-pay | Admitting: *Deleted

## 2021-03-01 DIAGNOSIS — Z79899 Other long term (current) drug therapy: Secondary | ICD-10-CM

## 2021-03-01 DIAGNOSIS — M359 Systemic involvement of connective tissue, unspecified: Secondary | ICD-10-CM

## 2021-03-02 LAB — COMPLETE METABOLIC PANEL WITH GFR
AG Ratio: 1.4 (calc) (ref 1.0–2.5)
ALT: 13 U/L (ref 6–29)
AST: 21 U/L (ref 10–30)
Albumin: 4.6 g/dL (ref 3.6–5.1)
Alkaline phosphatase (APISO): 40 U/L (ref 31–125)
BUN/Creatinine Ratio: 8 (calc) (ref 6–22)
BUN: 6 mg/dL — ABNORMAL LOW (ref 7–25)
CO2: 27 mmol/L (ref 20–32)
Calcium: 9.8 mg/dL (ref 8.6–10.2)
Chloride: 104 mmol/L (ref 98–110)
Creat: 0.73 mg/dL (ref 0.50–1.10)
GFR, Est African American: 133 mL/min/{1.73_m2} (ref >=60)
GFR, Est Non African American: 114 mL/min/{1.73_m2} (ref >=60)
Globulin: 3.2 g/dL (calc) (ref 1.9–3.7)
Glucose, Bld: 104 mg/dL — ABNORMAL HIGH (ref 65–99)
Potassium: 4.4 mmol/L (ref 3.5–5.3)
Sodium: 138 mmol/L (ref 135–146)
Total Bilirubin: 0.6 mg/dL (ref 0.2–1.2)
Total Protein: 7.8 g/dL (ref 6.1–8.1)

## 2021-03-02 LAB — CBC WITH DIFFERENTIAL/PLATELET
Absolute Monocytes: 498 cells/uL (ref 200–950)
Basophils Absolute: 18 cells/uL (ref 0–200)
Basophils Relative: 0.3 %
Eosinophils Absolute: 48 cells/uL (ref 15–500)
Eosinophils Relative: 0.8 %
HCT: 47.2 % — ABNORMAL HIGH (ref 35.0–45.0)
Hemoglobin: 15.8 g/dL — ABNORMAL HIGH (ref 11.7–15.5)
Lymphs Abs: 1842 cells/uL (ref 850–3900)
MCH: 29.1 pg (ref 27.0–33.0)
MCHC: 33.5 g/dL (ref 32.0–36.0)
MCV: 86.9 fL (ref 80.0–100.0)
MPV: 9.7 fL (ref 7.5–12.5)
Monocytes Relative: 8.3 %
Neutro Abs: 3594 cells/uL (ref 1500–7800)
Neutrophils Relative %: 59.9 %
Platelets: 277 10*3/uL (ref 140–400)
RBC: 5.43 10*6/uL — ABNORMAL HIGH (ref 3.80–5.10)
RDW: 12.6 % (ref 11.0–15.0)
Total Lymphocyte: 30.7 %
WBC: 6 10*3/uL (ref 3.8–10.8)

## 2021-03-02 NOTE — Progress Notes (Signed)
CBC and CMP stable.  LFTs WNL.

## 2021-03-26 HISTORY — PX: OTHER SURGICAL HISTORY: SHX169

## 2021-03-29 ENCOUNTER — Telehealth: Payer: Self-pay | Admitting: Gastroenterology

## 2021-03-29 ENCOUNTER — Telehealth: Payer: Self-pay

## 2021-03-29 NOTE — Telephone Encounter (Signed)
Patient called stating she has questions regarding the Vitamin D prescription.

## 2021-03-29 NOTE — Telephone Encounter (Signed)
I called patient, patient advised lab note from 01/18/2021, patient verbalized understanding, Vitamin D is low.  Please send a prescription for vitamin D 50,000 units once a week for 3 months.  Repeat vitamin D in 3 months.  Patient should take over-the-counter vitamin D 2000 units daily after finishing the course ofvitamin D.

## 2021-03-29 NOTE — Telephone Encounter (Signed)
Hey Dr. Lavon Paganini,   We received a referral; transfer of care for hx of ulcerative colitis. Patient has seen a GI in 2021. We received records. Could you please review and advise on scheduling?  Thank you.

## 2021-03-30 NOTE — Telephone Encounter (Signed)
No, please check if any of the other [place] providers are able to accommodate that transfer request.  Thanks

## 2021-03-31 NOTE — Telephone Encounter (Signed)
Hey Dr. Orvan Falconer,   We received a referral; transfer of care for hx of ulcerative colitis. Patient has seen a GI in 2021. We received records. Could you please review and advise on scheduling?  Thank you

## 2021-04-01 NOTE — Telephone Encounter (Signed)
I would recommend one of our new doctors. Thanks.

## 2021-04-01 NOTE — Telephone Encounter (Signed)
Patient is requesting female doctor

## 2021-04-07 NOTE — Telephone Encounter (Signed)
Left vm with patient to return call.  Jenel Lucks, MD  Mickel Baas,  I reviewed the packet for this patient, and you can assign her to me if she is still seeking a new GI provider.   Dr. Tomasa Rand

## 2021-04-12 NOTE — Telephone Encounter (Signed)
Left vm to return call.    

## 2021-04-20 ENCOUNTER — Other Ambulatory Visit: Payer: Self-pay | Admitting: *Deleted

## 2021-04-20 DIAGNOSIS — E559 Vitamin D deficiency, unspecified: Secondary | ICD-10-CM

## 2021-04-20 LAB — VITAMIN D 25 HYDROXY (VIT D DEFICIENCY, FRACTURES): Vit D, 25-Hydroxy: 46 ng/mL (ref 30–100)

## 2021-04-21 NOTE — Progress Notes (Signed)
Vitamin D WNL.  Please advise the patient to take a maintenance dose of vitamin D 1,000 units daily.

## 2021-05-10 NOTE — Progress Notes (Signed)
Office Visit Note  Patient: [name]             Date of Birth: 1996-04-17           MRN: [id number]             PCP: Mancel Bale, PA-C Referring: Mancel Bale, PA-C Visit Date: 05/24/2021 Occupation: _0 @  Subjective:  Low back pain   History of Present Illness: Ariana Scott is a 25 y.o. female with history of autoimmune disease. She is taking plaquenil 200 mg 1 tablet by mouth twice daily. She continues to tolerate PLQ without any side effects.  Patient reports that over the past several months has been experiencing increased lower back pain around the time of her menstrual cycle.  She describes the pain as a dull ache especially at night and has some stiffness and achiness in her lower back first thing in the morning.  She has also noted some increased stiffness in her neck.  She denies any other joint pain or joint swelling at this time.  She denies any new rashes and has not had any hair loss.  She continues to have ongoing photosensitivity and tries to avoid direct sun exposure.  She denies any oral or nasal ulcerations.  She has not had any sicca symptoms.  She denies any symptoms of Raynaud's.  She denies any shortness of breath, pleuritic chest pain, palpitations recently.      Activities of Daily Living:  Patient reports morning stiffness for 0 minutes.   Patient Reports nocturnal pain.  Difficulty dressing/grooming: Denies Difficulty climbing stairs: Denies Difficulty getting out of chair: Denies Difficulty using hands for taps, buttons, cutlery, and/or writing: Denies  Review of Systems  Constitutional:  Negative for fatigue.  HENT:  Negative for mouth sores, mouth dryness and nose dryness.   Eyes:  Negative for pain, itching and dryness.  Respiratory:  Negative for shortness of breath and difficulty breathing.   Cardiovascular:  Negative for chest pain and palpitations.  Gastrointestinal:  Negative for blood in stool, constipation and diarrhea.   Endocrine: Negative for increased urination.  Genitourinary:  Negative for difficulty urinating.  Musculoskeletal:  Positive for joint pain, joint pain, myalgias and myalgias. Negative for joint swelling, morning stiffness and muscle tenderness.  Skin:  Negative for color change, rash and redness.  Allergic/Immunologic: Negative for susceptible to infections.  Neurological:  Positive for numbness. Negative for dizziness, headaches, memory loss and weakness.  Hematological:  Negative for bruising/bleeding tendency.  Psychiatric/Behavioral:  Negative for confusion.    PMFS History:  Patient Active Problem List   Diagnosis Date Noted   Hypothyroidism due to Hashimoto's thyroiditis 03/24/2020   Chronic midline low back pain without sciatica 07/08/2019   Elevated LFTs 07/08/2019   Fatty liver 07/08/2019   Autoimmune disease ([place]) 07/08/2019   Anxiety 01/00/7121   Lichen planus 97/58/8325   Other acne 05/01/2019   Hypothyroidism (acquired) 01/24/2019    Past Medical History:  Diagnosis Date   Autoimmune disease ([place])    Eczema    Erosive lichen planus of vulva    Hashimoto's disease    Hypothyroidism    IBS (irritable bowel syndrome)    Polycystic ovarian syndrome     Family History  Problem Relation Age of Onset   Diabetes Mother    Depression Mother    ADD / ADHD Mother    Depression Father    Vitamin D deficiency Father    Colitis Father  Hypertension Father    High Cholesterol Father    ADD / ADHD Brother    Past Surgical History:  Procedure Laterality Date   COLONOSCOPY  2020   Dr. Collene Mares   eyelid surgery Right 03/26/2021   upper eyelid surgery   UPPER GASTROINTESTINAL ENDOSCOPY  2020   Dr. Collene Mares   WISDOM TOOTH EXTRACTION  2016   Social History   Social History Narrative   Not on file   Immunization History  Administered Date(s) Administered   DTaP 10/16/1995, 11/29/1995, 01/11/1996, 01/10/1997, 04/28/2000   HPV Quadrivalent 05/21/2007, 07/03/2008    Hepatitis A 05/21/2007, 07/03/2008   Hepatitis B September 05, 1995, 08/01/1995, 01/11/1996   HiB (PRP-OMP) 10/16/1995, 11/29/1995, 01/11/1996, 01/10/1997   IPV 10/16/1995, 11/29/1995, 01/11/1996, 04/28/2000   MMR 07/02/1996, 04/28/2000   Meningococcal Conjugate 05/21/2007   PFIZER(Purple Top)SARS-COV-2 Vaccination 12/07/2019, 12/31/2019, 08/18/2020   Tdap 04/23/2007     Objective: Vital Signs: BP 116/83 (BP Location: Left Arm, Patient Position: Sitting, Cuff Size: Normal)   Pulse 69   Ht _0  (1.651 m)   Wt 211 lb 3.2 oz (95.8 kg)   BMI 35.15 kg/m    Physical Exam Vitals and nursing note reviewed.  Constitutional:      Appearance: She is well-developed.  HENT:     Head: Normocephalic and atraumatic.  Eyes:     Conjunctiva/sclera: Conjunctivae normal.  Cardiovascular:     Rate and Rhythm: Normal rate and regular rhythm.     Heart sounds: Normal heart sounds.  Pulmonary:     Effort: Pulmonary effort is normal.     Breath sounds: Normal breath sounds.  Abdominal:     General: Bowel sounds are normal.     Palpations: Abdomen is soft.  Musculoskeletal:     Cervical back: Normal range of motion.  Lymphadenopathy:     Cervical: No cervical adenopathy.  Skin:    General: Skin is warm and dry.     Capillary Refill: Capillary refill takes less than 2 seconds.  Neurological:     Mental Status: She is alert and oriented to person, place, and time.  Psychiatric:        Behavior: Behavior normal.     Musculoskeletal Exam: C-spine has good range of motion with some discomfort.  Some tenderness over the left SI joint.  No midline spinal tenderness.  Shoulder joints, elbow joints, wrist joints, MCPs, PIPs, DIPs have good range of motion with no synovitis.  Complete fist formation bilaterally.  Hip joints have good range of motion with no groin pain.  Knee joints have good range of motion with no warmth or effusion.  Ankle joints have good range of motion with no tenderness or joint  swelling.  CDAI Exam: CDAI Score: -- Patient Global: --; Provider Global: -- Swollen: --; Tender: -- Joint Exam 05/24/2021   No joint exam has been documented for this visit   There is currently no information documented on the homunculus. Go to the Rheumatology activity and complete the homunculus joint exam.  Investigation: No additional findings.  Imaging: No results found.  Recent Labs: Lab Results  Component Value Date   WBC 6.0 03/01/2021   HGB 15.8 (H) 03/01/2021   PLT 277 03/01/2021   NA 138 03/01/2021   K 4.4 03/01/2021   CL 104 03/01/2021   CO2 27 03/01/2021   GLUCOSE 104 (H) 03/01/2021   BUN 6 (L) 03/01/2021   CREATININE 0.73 03/01/2021   BILITOT 0.6 03/01/2021   AST 21 03/01/2021   ALT  13 03/01/2021   PROT 7.8 03/01/2021   CALCIUM 9.8 03/01/2021   GFRAA 133 03/01/2021    Speciality Comments: PLQ eye exam: 06/23/2020 WNL. [facility]. Follow up in 12 months.  Procedures:  No procedures performed Allergies: Kiwi extract and Pineapple       Assessment / Plan:     Visit Diagnoses: Autoimmune disease ([place]) - Positive ANA, double-stranded DNA, positive Ro, history of photosensitivity, facial rash and elevated LFTs: She has not had any signs or symptoms of a flare recently.  She has clinically been doing well taking Plaquenil 200 mg 1 tablet by mouth twice daily.  She has not missed any doses of Plaquenil recently.  Lab work from 01/18/21 was reviewed today in the office: ANA (1:160NS) and Ro antibody remain positive, complements and ESR WNL, dsDNA was 35.  She is due to update lab work today.  Orders released.   She has not had any recent rashes, increased hair loss, or symptoms of Raynaud's.  She has ongoing photosensitivity and was advised to try to avoid direct sun exposure.  She has been experiencing some increased lower back pain worse around her menstrual cycle.  She declined updated x-rays of the lumbar spine and pelvis today.  Discussed the importance  of performing stretching and strengthening exercises on a daily basis.  She had no synovitis on examination today.  She has not had any sicca symptoms or oral or nasal ulcerations recently.  No increased shortness of breath, pleuritic chest pain, or palpitations. LFTs were within normal limits on 03/01/2021.  She will be establishing care at [facility] gastroenterology for routine follow-up. She will remain on Plaquenil as prescribed.  Hydroxychloroquine blood level was obtained today.  She will be due to update her Plaquenil eye exam October 2022.  She was given a Plaquenil eye exam form to take with her to her next appointment.  She does not need a refill of Plaquenil at this time. She is advised to notify us if she develops any signs or symptoms of a flare.  We will follow-up in the office in 5 months.   - Plan: Protein / creatinine ratio, urine, CBC with Differential/Platelet, COMPLETE METABOLIC PANEL WITH GFR, Anti-DNA antibody, double-stranded, Sedimentation rate, C3 and C4, Hydroxychloroquine, Blood  High risk medication use - Plaquenil 200 mg 1 tablet by mouth twice daily started in September 2020.  PLQ eye exam: 06/23/2020 WNL. [facility]. Follow up in 12 months.  She was given a PLQ eye exam form to take with her to her upcoming appointment.  Hydroxychloroquine blood level was ordered today.  CBC and CMP updated on 03/01/21.  CBC and CMP will be updated today.- Plan: CBC with Differential/Platelet, COMPLETE METABOLIC PANEL WITH GFR, Hydroxychloroquine, Blood  Chronic SI joint pain: She presents today with left SI joint tenderness to palpation.  She has been experiencing increased lower back pain getting up to her menstrual cycles.  She had no midline spinal tenderness on examination today.  She is good range of motion of both hip joints with no groin pain.  She declined updated x-rays of the pelvis and lumbar spine at this time.  X-rays of the lumbar spine were unremarkable on 05/01/2019.  We discussed  the use of ice or heating pad for symptomatic relief.  We also discussed the importance of performing her strengthening and lower back exercises on a daily basis.  Chronic midline low back pain without sciatica: X-rays of the lumbar spine were obtained on 05/01/2019  which were unremarkable.  She declined updated x-rays today in the office.  She was advised to notify us if her lower back pain persists or worsens.  Eczema of both hands: Not currently active.  Other fatigue: Stable discussed the importance of regular exercise and good sleep hygiene.  Vitamin D deficiency: Vitamin D was 46 on 04/20/2021.  She is currently taking vitamin D 2000 units every other day.  Other medical conditions are listed as follows:  Lichen planus  Fatty liver  History of hypothyroidism  Anxiety and depression  Orders: Orders Placed This Encounter  Procedures   Protein / creatinine ratio, urine   CBC with Differential/Platelet   COMPLETE METABOLIC PANEL WITH GFR   Anti-DNA antibody, double-stranded   Sedimentation rate   C3 and C4   Hydroxychloroquine, Blood   No orders of the defined types were placed in this encounter.    Follow-Up Instructions: Return in about 5 months (around 10/24/2021) for Autoimmune Disease.   Ofilia Neas, PA-C  Note - This record has been created using Dragon software.  Chart creation errors have been sought, but may not always  have been located. Such creation errors do not reflect on  the standard of medical care.

## 2021-05-16 ENCOUNTER — Other Ambulatory Visit: Payer: Self-pay | Admitting: Physician Assistant

## 2021-05-16 DIAGNOSIS — R768 Other specified abnormal immunological findings in serum: Secondary | ICD-10-CM

## 2021-05-18 NOTE — Telephone Encounter (Signed)
Last Visit: 01/18/2021  Next Visit: 05/24/2021  Labs: 03/01/2021 CBC and CMP stable.  LFTs WNL.   Eye exam:  06/23/2020 WNL   Current Dose per office note 01/18/2021: Plaquenil 200 mg 1 tablet by mouth twice daily   DX: Autoimmune disease    Last Fill:02/17/2021   Okay to refill Plaquenil?

## 2021-05-24 ENCOUNTER — Encounter: Payer: Self-pay | Admitting: Physician Assistant

## 2021-05-24 ENCOUNTER — Other Ambulatory Visit: Payer: Self-pay

## 2021-05-24 ENCOUNTER — Ambulatory Visit (INDEPENDENT_AMBULATORY_CARE_PROVIDER_SITE_OTHER): Payer: Managed Care, Other (non HMO) | Admitting: Physician Assistant

## 2021-05-24 VITALS — BP 116/83 | HR 69 | Ht 65.0 in | Wt 211.2 lb

## 2021-05-24 DIAGNOSIS — Z8639 Personal history of other endocrine, nutritional and metabolic disease: Secondary | ICD-10-CM

## 2021-05-24 DIAGNOSIS — M533 Sacrococcygeal disorders, not elsewhere classified: Secondary | ICD-10-CM

## 2021-05-24 DIAGNOSIS — M545 Low back pain, unspecified: Secondary | ICD-10-CM

## 2021-05-24 DIAGNOSIS — F32A Depression, unspecified: Secondary | ICD-10-CM

## 2021-05-24 DIAGNOSIS — L439 Lichen planus, unspecified: Secondary | ICD-10-CM

## 2021-05-24 DIAGNOSIS — Z79899 Other long term (current) drug therapy: Secondary | ICD-10-CM | POA: Diagnosis not present

## 2021-05-24 DIAGNOSIS — E559 Vitamin D deficiency, unspecified: Secondary | ICD-10-CM

## 2021-05-24 DIAGNOSIS — R5383 Other fatigue: Secondary | ICD-10-CM

## 2021-05-24 DIAGNOSIS — M359 Systemic involvement of connective tissue, unspecified: Secondary | ICD-10-CM

## 2021-05-24 DIAGNOSIS — F419 Anxiety disorder, unspecified: Secondary | ICD-10-CM

## 2021-05-24 DIAGNOSIS — L309 Dermatitis, unspecified: Secondary | ICD-10-CM

## 2021-05-24 DIAGNOSIS — G8929 Other chronic pain: Secondary | ICD-10-CM

## 2021-05-24 DIAGNOSIS — K760 Fatty (change of) liver, not elsewhere classified: Secondary | ICD-10-CM

## 2021-05-25 NOTE — Progress Notes (Signed)
dsDNA remains positive but stable. HCQ blood level is pending.

## 2021-05-25 NOTE — Progress Notes (Signed)
CBC and CMP stable. LFTs WNL.  ESR and complements WNL.  No proteinuria.

## 2021-06-01 ENCOUNTER — Ambulatory Visit: Payer: Managed Care, Other (non HMO) | Admitting: Gastroenterology

## 2021-06-02 LAB — CBC WITH DIFFERENTIAL/PLATELET
Absolute Monocytes: 513 cells/uL (ref 200–950)
Basophils Absolute: 22 cells/uL (ref 0–200)
Basophils Relative: 0.4 %
Eosinophils Absolute: 113 cells/uL (ref 15–500)
Eosinophils Relative: 2.1 %
HCT: 45.5 % — ABNORMAL HIGH (ref 35.0–45.0)
Hemoglobin: 15.6 g/dL — ABNORMAL HIGH (ref 11.7–15.5)
Lymphs Abs: 1939 cells/uL (ref 850–3900)
MCH: 29.9 pg (ref 27.0–33.0)
MCHC: 34.3 g/dL (ref 32.0–36.0)
MCV: 87.2 fL (ref 80.0–100.0)
MPV: 9.4 fL (ref 7.5–12.5)
Monocytes Relative: 9.5 %
Neutro Abs: 2813 cells/uL (ref 1500–7800)
Neutrophils Relative %: 52.1 %
Platelets: 248 10*3/uL (ref 140–400)
RBC: 5.22 10*6/uL — ABNORMAL HIGH (ref 3.80–5.10)
RDW: 12.7 % (ref 11.0–15.0)
Total Lymphocyte: 35.9 %
WBC: 5.4 10*3/uL (ref 3.8–10.8)

## 2021-06-02 LAB — HYDROXYCHLOROQUINE,BLOOD: HYDROXYCHLOROQUINE, (B): 890 ng/mL — ABNORMAL HIGH

## 2021-06-02 LAB — COMPLETE METABOLIC PANEL WITH GFR
AG Ratio: 1.5 (calc) (ref 1.0–2.5)
ALT: 12 U/L (ref 6–29)
AST: 17 U/L (ref 10–30)
Albumin: 4.6 g/dL (ref 3.6–5.1)
Alkaline phosphatase (APISO): 35 U/L (ref 31–125)
BUN/Creatinine Ratio: 8 (calc) (ref 6–22)
BUN: 6 mg/dL — ABNORMAL LOW (ref 7–25)
CO2: 27 mmol/L (ref 20–32)
Calcium: 9.9 mg/dL (ref 8.6–10.2)
Chloride: 102 mmol/L (ref 98–110)
Creat: 0.71 mg/dL (ref 0.50–0.96)
Globulin: 3.1 g/dL (calc) (ref 1.9–3.7)
Glucose, Bld: 84 mg/dL (ref 65–99)
Potassium: 4.4 mmol/L (ref 3.5–5.3)
Sodium: 138 mmol/L (ref 135–146)
Total Bilirubin: 0.4 mg/dL (ref 0.2–1.2)
Total Protein: 7.7 g/dL (ref 6.1–8.1)
eGFR: 121 mL/min/{1.73_m2} (ref >=60)

## 2021-06-02 LAB — SEDIMENTATION RATE: Sed Rate: 6 mm/h (ref 0–20)

## 2021-06-02 LAB — PROTEIN / CREATININE RATIO, URINE
Creatinine, Urine: 38 mg/dL (ref 20–275)
Total Protein, Urine: <4 mg/dL — ABNORMAL LOW (ref 5–24)

## 2021-06-02 LAB — C3 AND C4
C3 Complement: 150 mg/dL (ref 83–193)
C4 Complement: 28 mg/dL (ref 15–57)

## 2021-06-02 LAB — ANTI-DNA ANTIBODY, DOUBLE-STRANDED: ds DNA Ab: 39 IU/mL — ABNORMAL HIGH

## 2021-06-02 NOTE — Progress Notes (Signed)
HCQ drug level is within the therapeutic range.

## 2021-06-22 ENCOUNTER — Ambulatory Visit: Payer: Managed Care, Other (non HMO) | Admitting: Gastroenterology

## 2021-06-29 ENCOUNTER — Encounter: Payer: Self-pay | Admitting: Physician Assistant

## 2021-08-14 ENCOUNTER — Other Ambulatory Visit: Payer: Self-pay | Admitting: Physician Assistant

## 2021-08-14 DIAGNOSIS — R768 Other specified abnormal immunological findings in serum: Secondary | ICD-10-CM

## 2021-08-16 NOTE — Telephone Encounter (Signed)
Next Visit: 10/25/2021  Last Visit: 05/24/2021  Labs: 05/24/2021 CBC and CMP stable. LFTs WNL.  Eye exam: 06/23/2020 WNL   Current Dose per office note 05/24/2021: Plaquenil 200 mg 1 tablet by mouth twice daily   [id number] disease   Last Fill: 05/18/2021   Left message to advise patient she is due to update her PLQ eye exam.   Okay to refill Plaquenil?

## 2021-09-14 ENCOUNTER — Other Ambulatory Visit: Payer: Self-pay | Admitting: Physician Assistant

## 2021-09-14 DIAGNOSIS — R768 Other specified abnormal immunological findings in serum: Secondary | ICD-10-CM

## 2021-09-14 NOTE — Telephone Encounter (Signed)
Next Visit: 10/25/2021   Last Visit: 05/24/2021   Labs: 05/24/2021 CBC and CMP stable. LFTs WNL.   Eye exam: 06/29/2021 WNL   Current Dose per office note 05/24/2021: Plaquenil 200 mg 1 tablet by mouth twice daily    [id number] disease    Last Fill: 08/16/2021 (30 day supply)   Okay to refill Plaquenil?

## 2021-09-24 ENCOUNTER — Other Ambulatory Visit: Payer: Self-pay

## 2021-09-24 DIAGNOSIS — Z79899 Other long term (current) drug therapy: Secondary | ICD-10-CM

## 2021-09-24 DIAGNOSIS — M359 Systemic involvement of connective tissue, unspecified: Secondary | ICD-10-CM

## 2021-09-27 LAB — URINALYSIS, ROUTINE W REFLEX MICROSCOPIC
Bacteria, UA: NONE SEEN /HPF
Bilirubin Urine: NEGATIVE
Glucose, UA: NEGATIVE
Hyaline Cast: NONE SEEN /LPF
Ketones, ur: NEGATIVE
Leukocytes,Ua: NEGATIVE
Nitrite: NEGATIVE
Protein, ur: NEGATIVE
Specific Gravity, Urine: 1.004 (ref 1.001–1.035)
Squamous Epithelial / HPF: NONE SEEN /HPF (ref <=5)
WBC, UA: NONE SEEN /HPF (ref 0–5)
pH: 7.5 (ref 5.0–8.0)

## 2021-09-27 LAB — COMPLETE METABOLIC PANEL WITH GFR
AG Ratio: 1.4 (calc) (ref 1.0–2.5)
ALT: 11 U/L (ref 6–29)
AST: 21 U/L (ref 10–30)
Albumin: 4.6 g/dL (ref 3.6–5.1)
Alkaline phosphatase (APISO): 31 U/L (ref 31–125)
BUN: 9 mg/dL (ref 7–25)
CO2: 22 mmol/L (ref 20–32)
Calcium: 9.9 mg/dL (ref 8.6–10.2)
Chloride: 104 mmol/L (ref 98–110)
Creat: 0.71 mg/dL (ref 0.50–0.96)
Globulin: 3.2 g/dL (calc) (ref 1.9–3.7)
Glucose, Bld: 73 mg/dL (ref 65–99)
Potassium: 4.2 mmol/L (ref 3.5–5.3)
Sodium: 139 mmol/L (ref 135–146)
Total Bilirubin: 0.6 mg/dL (ref 0.2–1.2)
Total Protein: 7.8 g/dL (ref 6.1–8.1)
eGFR: 120 mL/min/{1.73_m2} (ref >=60)

## 2021-09-27 LAB — CBC WITH DIFFERENTIAL/PLATELET
Absolute Monocytes: 488 cells/uL (ref 200–950)
Basophils Absolute: 33 cells/uL (ref 0–200)
Basophils Relative: 0.5 %
Eosinophils Absolute: 33 cells/uL (ref 15–500)
Eosinophils Relative: 0.5 %
HCT: 46 % — ABNORMAL HIGH (ref 35.0–45.0)
Hemoglobin: 15.9 g/dL — ABNORMAL HIGH (ref 11.7–15.5)
Lymphs Abs: 1782 cells/uL (ref 850–3900)
MCH: 30 pg (ref 27.0–33.0)
MCHC: 34.6 g/dL (ref 32.0–36.0)
MCV: 86.8 fL (ref 80.0–100.0)
MPV: 9.6 fL (ref 7.5–12.5)
Monocytes Relative: 7.4 %
Neutro Abs: 4264 cells/uL (ref 1500–7800)
Neutrophils Relative %: 64.6 %
Platelets: 248 10*3/uL (ref 140–400)
RBC: 5.3 10*6/uL — ABNORMAL HIGH (ref 3.80–5.10)
RDW: 12.7 % (ref 11.0–15.0)
Total Lymphocyte: 27 %
WBC: 6.6 10*3/uL (ref 3.8–10.8)

## 2021-09-27 LAB — SEDIMENTATION RATE: Sed Rate: 2 mm/h (ref 0–20)

## 2021-09-27 LAB — C3 AND C4
C3 Complement: 156 mg/dL (ref 83–193)
C4 Complement: 29 mg/dL (ref 15–57)

## 2021-09-27 LAB — ANTI-DNA ANTIBODY, DOUBLE-STRANDED: ds DNA Ab: 31 IU/mL — ABNORMAL HIGH

## 2021-09-27 LAB — MICROSCOPIC MESSAGE

## 2021-09-27 NOTE — Progress Notes (Signed)
Labs are stable. I will discuss results at the follow up visit.

## 2021-09-29 ENCOUNTER — Encounter: Payer: Self-pay | Admitting: Rheumatology

## 2021-10-05 ENCOUNTER — Ambulatory Visit: Payer: Managed Care, Other (non HMO) | Admitting: Internal Medicine

## 2021-10-15 ENCOUNTER — Other Ambulatory Visit: Payer: Self-pay

## 2021-10-15 ENCOUNTER — Encounter: Payer: Self-pay | Admitting: Internal Medicine

## 2021-10-15 ENCOUNTER — Ambulatory Visit: Payer: 59 | Admitting: Internal Medicine

## 2021-10-15 VITALS — BP 120/72 | HR 108 | Ht 65.0 in | Wt 211.6 lb

## 2021-10-15 DIAGNOSIS — E038 Other specified hypothyroidism: Secondary | ICD-10-CM

## 2021-10-15 DIAGNOSIS — E063 Autoimmune thyroiditis: Secondary | ICD-10-CM | POA: Diagnosis not present

## 2021-10-15 DIAGNOSIS — R631 Polydipsia: Secondary | ICD-10-CM

## 2021-10-15 LAB — TSH: TSH: 2.41 u[IU]/mL (ref 0.35–5.50)

## 2021-10-15 LAB — T4, FREE: Free T4: 0.82 ng/dL (ref 0.60–1.60)

## 2021-10-15 LAB — HEMOGLOBIN A1C: Hgb A1c MFr Bld: 5.1 % (ref 4.6–6.5)

## 2021-10-15 MED ORDER — LEVOTHYROXINE SODIUM 50 MCG PO TABS: 75.0000 ug | ORAL_TABLET | Freq: Every day | ORAL | 3 refills | Status: DC

## 2021-10-15 NOTE — Patient Instructions (Signed)
Please continue Levothyroxine 75 mcg every day.  Take the thyroid hormone every day, with water, at least 30 minutes before breakfast, separated by at least 4 hours from: - acid reflux medications - calcium - iron - multivitamins  Please stop at the lab.  Please come back for a follow-up appointment in 1 year. 

## 2021-10-15 NOTE — Progress Notes (Signed)
Patient ID: Ariana Scott, female   DOB: 1996-06-02, 26 y.o.   MRN: [id number]   This visit occurred during the SARS-CoV-2 public health emergency.  Safety protocols were in place, including screening questions prior to the visit, additional usage of staff PPE, and extensive cleaning of exam room while observing appropriate contact time as indicated for disinfecting solutions.   HPI  Ariana Scott is a 26 y.o.-year-old female, initially referred by Dr. Loreta Ave, returning for follow-up for Hashimoto's hypothyroidism.  Last visit 1 year ago.  Interim hx: She mentions she has memory pbs.  Starts an activity, gets distracted, and forgets about it. She also has HAs, nightmares. She is constantly thirsty but no increased urination. No weight changes. She is very active at work.  Reviewed history: Pt. was diagnosed with hypothyroidism in 11/2018 during investigation for IBS by Dr. Loreta Ave.    Patient has had constipation for years, but she then started to have loose stools (for 4 years) and was sent to GI.  Dr. Loreta Ave checked her TSH which returned very high, at 42.35.   She was started on levothyroxine 75 mcg daily and referred to endocrinology.  In the past, she was taking 52-59 mcg levothyroxine daily, but was having palpitations so we switched to 50 mcg tablets, which are white.  She tolerates this well.  Pt is on levothyroxine 50 +25 mcg daily, taken: - in am - fasting - at least 30 min-1h from b'fast - no calcium - no iron - no multivitamins - no PPIs - not on Biotin On vitamin C and D.  I reviewed patient's TFTs: Lab Results  Component Value Date   TSH 1.50 10/06/2020   TSH 2.35 03/24/2020   TSH 2.58 11/11/2019   TSH 4.19 09/16/2019   TSH 6.97 (H) 07/23/2019   TSH 5.72 (H) 06/18/2019   TSH 85.26 (H) 04/01/2019   TSH 1.38 01/24/2019   FREET4 0.90 10/06/2020   FREET4 0.90 03/24/2020   FREET4 0.91 11/11/2019   FREET4 0.91 09/16/2019   FREET4 0.82 07/23/2019   FREET4 0.82  06/18/2019   FREET4 0.46 (L) 04/01/2019   FREET4 0.97 01/24/2019  12/11/2018: TSH 42.35  Her antithyroid antibodies are high, pointing towards a diagnosis of Hashimoto's thyroiditis Component     Latest Ref Rng & Units 01/24/2019  Thyroperoxidase Ab SerPl-aCnc     <9 IU/mL 395 (H)  Thyroglobulin Ab     < or = 1 IU/mL 2 (H)   She previously had palpitations, which improved after changing levothyroxine tablets twice ones (50 mcg).  Pt denies: - feeling nodules in neck - hoarseness - dysphagia - choking - SOB with lying down  She has + FH of thyroid disorders in: PGGF. No FH of thyroid cancer. No h/o radiation tx to head or neck.  No herbal supplements. No Biotin use. No recent steroids use.   She also has a history of trochanteric bursitis.  She had migraines with visual aura - on OCPs for years for her amenorrhea/irregular menses. She stopped OCPs afterwards. No more migraines.  Also, her menstrual cycles are now almost normal.  She was also found to have elevated LFTs at investigation by Dr. Loreta Ave.  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  She had a liver ultrasound >> fatty liver.   She has FH in mother and all of her sisters.  Lab Results  Component Value Date   HGBA1C 4.9 02/17/2020   ROS: + see  HPI  I reviewed pt's medications, allergies, PMH, social hx, family hx, and changes were documented in the history of present illness. Otherwise, unchanged from my initial visit note.  Past Medical History:  Diagnosis Date   Autoimmune disease ([facility])    Eczema    Erosive lichen planus of vulva    Hashimoto's disease    Hypothyroidism    IBS (irritable bowel syndrome)    Polycystic ovarian syndrome    Past Surgical History:  Procedure Laterality Date   COLONOSCOPY  2020   Dr. Loreta Ave   eyelid surgery Right 03/26/2021   upper eyelid surgery   UPPER GASTROINTESTINAL ENDOSCOPY  2020   Dr. Loreta Ave   WISDOM TOOTH EXTRACTION  2016    Social History   Socioeconomic History   Marital status: Single    Spouse name: Not on file   Number of children: 0   Years of education: Not on file   Highest education level: Not on file  Occupational History    Recent college graduate, unemployed  Social Network engineer strain: Not on file   Food insecurity:    Worry: Not on file    Inability: Not on file   Transportation needs:    Medical: Not on file    Non-medical: Not on file  Tobacco Use   Smoking status: Never Smoker   Smokeless tobacco: Never Used  Substance and Sexual Activity   Alcohol use:  1 glass of wine,   Drug use: No   Current Outpatient Medications on File Prior to Visit  Medication Sig Dispense Refill   Azelaic Acid 15 % cream Apply on the entire face daily after cleansing     cholecalciferol (VITAMIN D3) 25 MCG (1000 UNIT) tablet Take 2,000 Units by mouth every other day.     clobetasol ointment (TEMOVATE) 0.05 % daily as needed.     hydroxychloroquine (PLAQUENIL) 200 MG tablet TAKE 1 TABLET BY MOUTH TWICE A DAY 180 tablet 0   levothyroxine (SYNTHROID) 50 MCG tablet Take 1.5 tablets (75 mcg total) by mouth daily before breakfast. 135 tablet 3   No current facility-administered medications on file prior to visit.   Allergies  Allergen Reactions   Kiwi Extract Itching   Pineapple Itching   Family history: -Mother, aunt with diabetes -Father with HTN and HL -Cancer in paternal grandmother: Breast and uterus + See HPI  PE: BP 120/72 (BP Location: Right Arm, Patient Position: Sitting, Cuff Size: Normal)    Pulse (!) 108    Ht 5\' 5"  (1.651 m)    Wt 211 lb 9.6 oz (96 kg)    SpO2 97%    BMI 35.21 kg/m  Wt Readings from Last 3 Encounters:  10/15/21 211 lb 9.6 oz (96 kg)  05/24/21 211 lb 3.2 oz (95.8 kg)  01/18/21 211 lb 6.4 oz (95.9 kg)   Constitutional: overweight, in NAD Eyes: PERRLA, EOMI, no exophthalmos ENT: moist mucous membranes, no thyromegaly, no cervical  lymphadenopathy Cardiovascular: tachycardia, RR, No MRG Respiratory: CTA B Musculoskeletal: no deformities, strength intact in all 4 Skin: moist, warm, no rashes Neurological: no tremor with outstretched hands, DTR normal in all 4  ASSESSMENT: 1. Hypothyroidism -Due to Hashimoto's thyroiditis  2.  Increased thirst  PLAN:  1. Patient with h history of autoimmune hypothyroidism.  She initially had symptoms consistent with hypothyroidism: Weight gain, dry skin, change in bowel habits, but also anxiety, tachycardia, tremors.  She takes anxiolytics as needed.  She felt better after starting  levothyroxine, with less fatigue.  She initially had headaches, but this resolved after stopping OCPs.  She also had palpitations which resolved after switching to white levothyroxine tablets.  At last visit, however, she got 75 mcg levothyroxine daily due to a misunderstanding with the pharmacy.  Her TSH was normal afterwards so we cont'd this dose. - latest thyroid labs reviewed with pt. >> normal: Lab Results  Component Value Date   TSH 1.50 10/06/2020  - she continues on LT4 75 mcg daily - pt feels good on this dose but mentions getting distracted and having problems with memory. - we discussed about taking the thyroid hormone every day, with water, >30 minutes before breakfast, separated by >4 hours from acid reflux medications, calcium, iron, multivitamins. Pt. is taking it correctly. - will check thyroid tests today: TSH and fT4 - If labs are abnormal, she will need to return for repeat TFTs in 1.5 months - OTW, I will see her back in 1 year  2.  Increased thirst -Without exaggerated urination -No weight loss or increased hunger -He does have family history of diabetes on her mother side -Latest HbA1c from 2021 and this was normal -We will recheck her HbA1c at today's visit.  Discussed about HbA1c intervals to define normal range, prediabetes, and diabetes.  Needs refills.  Component      Latest Ref Rng & Units 10/15/2021  TSH     0.35 - 5.50 uIU/mL 2.41  T4,Free(Direct)     0.60 - 1.60 ng/dL 1.610.82  Hemoglobin [id number]     4.6 - 6.5 % 5.1  TFTs are normal.  No prediabetes or diabetes.  Carlus Pavlovristina Yariah Selvey, MD PhD [facility]

## 2021-10-22 NOTE — Progress Notes (Signed)
Office Visit Note  Patient: Ariana Scott             Date of Birth: [birth year]           MRN: [id number]             PCP: Mancel Bale, PA-C Referring: Mancel Bale, PA-C Visit Date: 11/05/2021 Occupation: _0 @  Subjective:  Fatigue, intermittent rash, dry mouth.  History of Present Illness: Ariana Scott is a 26 y.o. female history of autoimmune disease.  She continues to work at the [employer].  She states she continues to have dry mouth and dry eyes eyes.  Despite drinking plenty of fluids she feels dehydrated.  Her mouth stays very dry.  She has also noticed some episodes of rash on her chest which comes and goes.  She feels exhausted after being in the sun all day.  She also notices low-grade fever.  She has not taken her temperature.  She gives history of lower back pain off and on.  Activities of Daily Living:  Patient reports morning stiffness for 0 minutes.   Patient Denies nocturnal pain.  Difficulty dressing/grooming: Denies Difficulty climbing stairs: Denies Difficulty getting out of chair: Denies Difficulty using hands for taps, buttons, cutlery, and/or writing: Denies  Review of Systems  Constitutional:  Negative for fatigue.  HENT:  Negative for mouth sores, mouth dryness and nose dryness.   Eyes:  Positive for dryness. Negative for pain and itching.  Respiratory:  Negative for shortness of breath and difficulty breathing.   Cardiovascular:  Negative for chest pain and palpitations.  Gastrointestinal:  Negative for blood in stool, constipation and diarrhea.  Endocrine: Positive for excessive thirst. Negative for increased urination.  Genitourinary:  Negative for difficulty urinating.  Musculoskeletal:  Negative for joint pain, joint pain, joint swelling, myalgias, morning stiffness, muscle tenderness and myalgias.  Skin:  Positive for rash and sensitivity to sunlight. Negative for color change.  Allergic/Immunologic: Negative for susceptible to  infections.  Neurological:  Positive for headaches and memory loss. Negative for dizziness, numbness and weakness.  Hematological:  Negative for bruising/bleeding tendency and swollen glands.  Psychiatric/Behavioral:  Positive for depressed mood and sleep disturbance. Negative for confusion. The patient is nervous/anxious.    PMFS History:  Patient Active Problem List   Diagnosis Date Noted   Hypothyroidism due to Hashimoto's thyroiditis 03/24/2020   Chronic midline low back pain without sciatica 07/08/2019   Elevated LFTs 07/08/2019   Fatty liver 07/08/2019   Autoimmune disease ([place]) 07/08/2019   Anxiety 67/89/3810   Lichen planus 17/51/0258   Other acne 05/01/2019   Hypothyroidism (acquired) 01/24/2019    Past Medical History:  Diagnosis Date   Autoimmune disease ([place])    Eczema    Erosive lichen planus of vulva    Hashimoto's disease    Hypothyroidism    IBS (irritable bowel syndrome)    Polycystic ovarian syndrome     Family History  Problem Relation Age of Onset   Diabetes Mother    Depression Mother    ADD / ADHD Mother    Depression Father    Vitamin D deficiency Father    Colitis Father    Hypertension Father    High Cholesterol Father    ADD / ADHD Brother    Past Surgical History:  Procedure Laterality Date   COLONOSCOPY  2020   Dr. Collene Mares   eyelid surgery Right 03/26/2021   upper eyelid surgery   UPPER GASTROINTESTINAL  ENDOSCOPY  2020   Dr. Collene Mares   WISDOM TOOTH EXTRACTION  2016   Social History   Social History Narrative   Not on file   Immunization History  Administered Date(s) Administered   DTaP 10/16/1995, 11/29/1995, 01/11/1996, 01/10/1997, 04/28/2000   HPV Quadrivalent 05/21/2007, 07/03/2008   Hepatitis A 05/21/2007, 07/03/2008   Hepatitis B March 20, 1995, 08/01/1995, 01/11/1996   HiB (PRP-OMP) 10/16/1995, 11/29/1995, 01/11/1996, 01/10/1997   IPV 10/16/1995, 11/29/1995, 01/11/1996, 04/28/2000   MMR 07/02/1996, 04/28/2000   Meningococcal  Conjugate 05/21/2007   PFIZER(Purple Top)SARS-COV-2 Vaccination 12/07/2019, 12/31/2019, 08/18/2020   Tdap 04/23/2007     Objective: Vital Signs: BP 121/84 (BP Location: Left Arm, Patient Position: Sitting, Cuff Size: Large)    Pulse 76    Ht 5' 5.5" (1.664 m)    Wt 211 lb 3.2 oz (95.8 kg)    BMI 34.61 kg/m    Physical Exam Vitals and nursing note reviewed.  Constitutional:      Appearance: She is well-developed.  HENT:     Head: Normocephalic and atraumatic.  Eyes:     Conjunctiva/sclera: Conjunctivae normal.  Cardiovascular:     Rate and Rhythm: Normal rate and regular rhythm.     Heart sounds: Normal heart sounds.  Pulmonary:     Effort: Pulmonary effort is normal.     Breath sounds: Normal breath sounds.  Abdominal:     General: Bowel sounds are normal.     Palpations: Abdomen is soft.  Musculoskeletal:     Cervical back: Normal range of motion.  Lymphadenopathy:     Cervical: No cervical adenopathy.  Skin:    General: Skin is warm and dry.     Capillary Refill: Capillary refill takes less than 2 seconds.  Neurological:     Mental Status: She is alert and oriented to person, place, and time.  Psychiatric:        Behavior: Behavior normal.     Musculoskeletal Exam: C-spine thoracic and lumbar spine with good range of motion.  She had excessive lumbar lordosis.  Shoulder joints, elbow joints, wrist joints with good range of motion.  She has hypermobility in her elbows and wrist joints.  No MCP PIP or DIP swelling was noted.  Hip joints and knee joints in good range of motion with no warmth swelling or effusion.  There was no tenderness over ankles or MTPs.  CDAI Exam: CDAI Score: -- Patient Global: --; Provider Global: -- Swollen: --; Tender: -- Joint Exam 11/05/2021   No joint exam has been documented for this visit   There is currently no information documented on the homunculus. Go to the Rheumatology activity and complete the homunculus joint  exam.  Investigation: No additional findings.  Imaging: No results found.  Recent Labs: Lab Results  Component Value Date   WBC 6.6 09/24/2021   HGB 15.9 (H) 09/24/2021   PLT 248 09/24/2021   NA 139 09/24/2021   K 4.2 09/24/2021   CL 104 09/24/2021   CO2 22 09/24/2021   GLUCOSE 73 09/24/2021   BUN 9 09/24/2021   CREATININE 0.71 09/24/2021   BILITOT 0.6 09/24/2021   AST 21 09/24/2021   ALT 11 09/24/2021   PROT 7.8 09/24/2021   CALCIUM 9.9 09/24/2021   GFRAA 133 03/01/2021    Speciality Comments: PLQ eye exam: 06/29/2021 [facility]. Follow up in 12 months.  Procedures:  No procedures performed Allergies: Kiwi extract and Pineapple   Assessment / Plan:     Visit Diagnoses: Autoimmune  disease ([place]) - Positive ANA, double-stranded DNA, positive Ro, history of photosensitivity, facial rash and elevated LFTs: -She gives history of photosensitivity and intermittent rash on her chest.  She has not had any joint pain or joint swelling recently.  She continues to have dry mouth.  She states she drinks water all day and still feels that her mouth is dry.  She has mild dry eye symptoms.  We discussed the use of pilocarpine but she did not like the side effects.  She has noticed improvement in her symptoms since she is taking hydroxychloroquine.  Plan: Protein / creatinine ratio, urine, Anti-DNA antibody, double-stranded, C3 and C4, Sedimentation rate  High risk medication use - Plaquenil 200 mg 1 tablet by mouth twice daily started in September 2020. PLQ eye exam: 06/29/2021  -Labs from September 24, 2021 were reviewed which were stable.  Her double-stranded DNA was a still positive, complements were normal and sed rate was normal.  We will repeat labs in 4 months.  Plan: CBC with Differential/Platelet, COMPLETE METABOLIC PANEL WITH GFR  Other fatigue-she continues to have some fatigue.  Chronic SI joint pain-she has intermittent pain in the SI joints.  She had no tenderness on my  examination today.  Chronic midline low back pain without sciatica - X-rays of the lumbar spine were obtained on 05/01/2019 which were unremarkable.  She continues to have some lower back discomfort.  She had lumbar lordosis.  Some of the exercises were demonstrated in the office and a handout on back exercises was given.  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  Lichen planus-improved per patient.  Fatty liver-her LFTs are normal now.  History of hypothyroidism  Anxiety and depression-patient states her anxiety and depression is better but not completely controlled.  Need for regular exercise and relaxation was discussed.  Vitamin D deficiency-she has been taking vitamin D supplement.  Her vitamin D was normal on April 20, 2021.  Orders: Orders Placed This Encounter  Procedures   Protein / creatinine ratio, urine   CBC with Differential/Platelet   COMPLETE METABOLIC PANEL WITH GFR   Anti-DNA antibody, double-stranded   C3 and C4   Sedimentation rate   No orders of the defined types were placed in this encounter.   Follow-Up Instructions: Return in about 4 months (around 03/05/2022) for Autoimmune disease.   Bo Merino, MD  Note - This record has been created using Editor, commissioning.  Chart creation errors have been sought, but may not always  have been located. Such creation errors do not reflect on  the standard of medical care.

## 2021-10-25 ENCOUNTER — Ambulatory Visit: Payer: 59 | Admitting: Rheumatology

## 2021-11-05 ENCOUNTER — Other Ambulatory Visit: Payer: Self-pay

## 2021-11-05 ENCOUNTER — Encounter: Payer: Self-pay | Admitting: Rheumatology

## 2021-11-05 ENCOUNTER — Ambulatory Visit: Payer: 59 | Admitting: Rheumatology

## 2021-11-05 VITALS — BP 121/84 | HR 76 | Ht 65.5 in | Wt 211.2 lb

## 2021-11-05 DIAGNOSIS — M545 Low back pain, unspecified: Secondary | ICD-10-CM

## 2021-11-05 DIAGNOSIS — F32A Depression, unspecified: Secondary | ICD-10-CM

## 2021-11-05 DIAGNOSIS — M359 Systemic involvement of connective tissue, unspecified: Secondary | ICD-10-CM

## 2021-11-05 DIAGNOSIS — K760 Fatty (change of) liver, not elsewhere classified: Secondary | ICD-10-CM

## 2021-11-05 DIAGNOSIS — M533 Sacrococcygeal disorders, not elsewhere classified: Secondary | ICD-10-CM

## 2021-11-05 DIAGNOSIS — E559 Vitamin D deficiency, unspecified: Secondary | ICD-10-CM

## 2021-11-05 DIAGNOSIS — G8929 Other chronic pain: Secondary | ICD-10-CM

## 2021-11-05 DIAGNOSIS — Z8639 Personal history of other endocrine, nutritional and metabolic disease: Secondary | ICD-10-CM

## 2021-11-05 DIAGNOSIS — R5383 Other fatigue: Secondary | ICD-10-CM | POA: Diagnosis not present

## 2021-11-05 DIAGNOSIS — L309 Dermatitis, unspecified: Secondary | ICD-10-CM

## 2021-11-05 DIAGNOSIS — Z79899 Other long term (current) drug therapy: Secondary | ICD-10-CM | POA: Diagnosis not present

## 2021-11-05 DIAGNOSIS — F419 Anxiety disorder, unspecified: Secondary | ICD-10-CM

## 2021-11-05 DIAGNOSIS — L439 Lichen planus, unspecified: Secondary | ICD-10-CM

## 2021-11-05 NOTE — Patient Instructions (Signed)
Return in 4 months for labs  Back Exercises The following exercises strengthen the muscles that help to support the trunk (torso) and back. They also help to keep the lower back flexible. Doing these exercises can help to prevent or lessen existing low back pain. If you have back pain or discomfort, try doing these exercises 2-3 times each day or as told by your health care provider. As your pain improves, do them once each day, but increase the number of times that you repeat the steps for each exercise (do more repetitions). To prevent the recurrence of back pain, continue to do these exercises once each day or as told by your health care provider. Do exercises exactly as told by your health care provider and adjust them as directed. It is normal to feel mild stretching, pulling, tightness, or discomfort as you do these exercises, but you should stop right away if you feel sudden pain or your pain gets worse. Exercises Single knee to chest Repeat these steps 3-5 times for each leg: Lie on your back on a firm bed or the floor with your legs extended. Bring one knee to your chest. Your other leg should stay extended and in contact with the floor. Hold your knee in place by grabbing your knee or thigh with both hands and hold. Pull on your knee until you feel a gentle stretch in your lower back or buttocks. Hold the stretch for 10-30 seconds. Slowly release and straighten your leg.  Pelvic tilt Repeat these steps 5-10 times: Lie on your back on a firm bed or the floor with your legs extended. Bend your knees so they are pointing toward the ceiling and your feet are flat on the floor. Tighten your lower abdominal muscles to press your lower back against the floor. This motion will tilt your pelvis so your tailbone points up toward the ceiling instead of pointing to your feet or the floor. With gentle tension and even breathing, hold this position for 5-10 seconds.  Cat-cow Repeat these steps  until your lower back becomes more flexible: Get into a hands-and-knees position on a firm bed or the floor. Keep your hands under your shoulders, and keep your knees under your hips. You may place padding under your knees for comfort. Let your head hang down toward your chest. Contract your abdominal muscles and point your tailbone toward the floor so your lower back becomes rounded like the back of a cat. Hold this position for 5 seconds. Slowly lift your head, let your abdominal muscles relax, and point your tailbone up toward the ceiling so your back forms a sagging arch like the back of a cow. Hold this position for 5 seconds.  Press-ups Repeat these steps 5-10 times: Lie on your abdomen (face-down) on a firm bed or the floor. Place your palms near your head, about shoulder-width apart. Keeping your back as relaxed as possible and keeping your hips on the floor, slowly straighten your arms to raise the top half of your body and lift your shoulders. Do not use your back muscles to raise your upper torso. You may adjust the placement of your hands to make yourself more comfortable. Hold this position for 5 seconds while you keep your back relaxed. Slowly return to lying flat on the floor.  Bridges Repeat these steps 10 times: Lie on your back on a firm bed or the floor. Bend your knees so they are pointing toward the ceiling and your feet are flat on  the floor. Your arms should be flat at your sides, next to your body. Tighten your buttocks muscles and lift your buttocks off the floor until your waist is at almost the same height as your knees. You should feel the muscles working in your buttocks and the back of your thighs. If you do not feel these muscles, slide your feet 1-2 inches (2.5-5 cm) farther away from your buttocks. Hold this position for 3-5 seconds. Slowly lower your hips to the starting position, and allow your buttocks muscles to relax completely. If this exercise is too  easy, try doing it with your arms crossed over your chest. Abdominal crunches Repeat these steps 5-10 times: Lie on your back on a firm bed or the floor with your legs extended. Bend your knees so they are pointing toward the ceiling and your feet are flat on the floor. Cross your arms over your chest. Tip your chin slightly toward your chest without bending your neck. Tighten your abdominal muscles and slowly raise your torso high enough to lift your shoulder blades a tiny bit off the floor. Avoid raising your torso higher than that because it can put too much stress on your lower back and does not help to strengthen your abdominal muscles. Slowly return to your starting position.  Back lifts Repeat these steps 5-10 times: Lie on your abdomen (face-down) with your arms at your sides, and rest your forehead on the floor. Tighten the muscles in your legs and your buttocks. Slowly lift your chest off the floor while you keep your hips pressed to the floor. Keep the back of your head in line with the curve in your back. Your eyes should be looking at the floor. Hold this position for 3-5 seconds. Slowly return to your starting position.  Contact a health care provider if: Your back pain or discomfort gets much worse when you do an exercise. Your worsening back pain or discomfort does not lessen within 2 hours after you exercise. If you have any of these problems, stop doing these exercises right away. Do not do them again unless your health care provider says that you can. Get help right away if: You develop sudden, severe back pain. If this happens, stop doing the exercises right away. Do not do them again unless your health care provider says that you can. This information is not intended to replace advice given to you by your health care provider. Make sure you discuss any questions you have with your health care provider. Document Revised: 02/23/2021 Document Reviewed: 11/11/2020 Elsevier  Patient Education  2022 [employer].

## 2021-11-12 ENCOUNTER — Other Ambulatory Visit (HOSPITAL_COMMUNITY): Payer: Self-pay

## 2021-11-16 ENCOUNTER — Emergency Department (HOSPITAL_COMMUNITY): Payer: 59

## 2021-11-16 ENCOUNTER — Emergency Department (HOSPITAL_COMMUNITY)
Admission: EM | Admit: 2021-11-16 | Discharge: 2021-11-16 | Disposition: A | Discharge status: Home / Self Care | Payer: 59 | Attending: Emergency Medicine | Admitting: Emergency Medicine

## 2021-11-16 ENCOUNTER — Other Ambulatory Visit: Payer: Self-pay

## 2021-11-16 ENCOUNTER — Encounter (HOSPITAL_COMMUNITY): Payer: Self-pay

## 2021-11-16 DIAGNOSIS — R479 Unspecified speech disturbances: Secondary | ICD-10-CM | POA: Diagnosis not present

## 2021-11-16 DIAGNOSIS — R202 Paresthesia of skin: Secondary | ICD-10-CM | POA: Diagnosis not present

## 2021-11-16 DIAGNOSIS — R002 Palpitations: Secondary | ICD-10-CM | POA: Diagnosis not present

## 2021-11-16 DIAGNOSIS — H538 Other visual disturbances: Secondary | ICD-10-CM | POA: Insufficient documentation

## 2021-11-16 DIAGNOSIS — R42 Dizziness and giddiness: Secondary | ICD-10-CM | POA: Diagnosis not present

## 2021-11-16 LAB — URINALYSIS, ROUTINE W REFLEX MICROSCOPIC
Bilirubin Urine: NEGATIVE
Glucose, UA: NEGATIVE mg/dL
Hgb urine dipstick: NEGATIVE
Ketones, ur: NEGATIVE mg/dL
Nitrite: NEGATIVE
Protein, ur: NEGATIVE mg/dL
Specific Gravity, Urine: 1.013 (ref 1.005–1.030)
pH: 8 (ref 5.0–8.0)

## 2021-11-16 LAB — PREGNANCY, URINE: Preg Test, Ur: NEGATIVE

## 2021-11-16 LAB — BASIC METABOLIC PANEL
Anion gap: 7 (ref 5–15)
BUN: 9 mg/dL (ref 6–20)
CO2: 21 mmol/L — ABNORMAL LOW (ref 22–32)
Calcium: 9.2 mg/dL (ref 8.9–10.3)
Chloride: 107 mmol/L (ref 98–111)
Creatinine, Ser: 0.63 mg/dL (ref 0.44–1.00)
GFR, Estimated: >60 mL/min (ref >60)
Glucose, Bld: 94 mg/dL (ref 70–99)
Potassium: 4 mmol/L (ref 3.5–5.1)
Sodium: 135 mmol/L (ref 135–145)

## 2021-11-16 LAB — CBC
HCT: 47.8 % — ABNORMAL HIGH (ref 36.0–46.0)
Hemoglobin: 16.3 g/dL — ABNORMAL HIGH (ref 12.0–15.0)
MCH: 29.4 pg (ref 26.0–34.0)
MCHC: 34.1 g/dL (ref 30.0–36.0)
MCV: 86.3 fL (ref 80.0–100.0)
Platelets: 229 10*3/uL (ref 150–400)
RBC: 5.54 MIL/uL — ABNORMAL HIGH (ref 3.87–5.11)
RDW: 12 % (ref 11.5–15.5)
WBC: 6.5 10*3/uL (ref 4.0–10.5)
nRBC: 0 % (ref 0.0–0.2)

## 2021-11-16 LAB — CBG MONITORING, ED: Glucose-Capillary: 83 mg/dL (ref 70–99)

## 2021-11-16 NOTE — ED Triage Notes (Signed)
Pt arrives with c/o dizziness episode that happened yesterday that lasted about 20 minutes. Pt endorse some right sided leg/foot tingling. Pt denies headache or n/v.  ?

## 2021-11-16 NOTE — ED Provider Triage Note (Signed)
Emergency Medicine Provider Triage Evaluation Note ? ?Ariana Scott , a 26 y.o. female  was evaluated in triage.  Pt complains of dizziness onset last night while sitting, room spinning, lasted 20 minutes, no change in dizziness with standing. Then experienced palpitations x 20 minutes. Felt hazy/confused. ?Today felt better upon waking, went for jog, at end of jog felt right hip/thigh area aching/resistance, feeling radiated to ankle/foot, then tingling in the right foot.  ?At this time, foot feels fuzzy, ?anxiety, not dizzy ? ?Review of Systems  ?Positive: Dizziness  ?Negative: SHOB, CP, fever ? ?Physical Exam  ?BP (!) 129/95 (BP Location: Left Arm)   Pulse 79   Temp 98.5 ?F (36.9 ?C) (Oral)   Resp 18   Ht 5' 5.5" (1.664 m)   Wt 95.3 kg   SpO2 97%   BMI 34.41 kg/m?  ?Gen:   Awake, no distress   ?Resp:  Normal effort  ?MSK:   Moves extremities without difficulty  ?Other:   ? ?Medical Decision Making  ?Medically screening exam initiated at 4:33 PM.  Appropriate orders placed.  FAIRY ASHLOCK was informed that the remainder of the evaluation will be completed by another provider, this initial triage assessment does not replace that evaluation, and the importance of remaining in the ED until their evaluation is complete. ? ? ?  ?Jeannie Fend, PA-C ?11/16/21 1905 ? ?

## 2021-11-16 NOTE — ED Provider Notes (Signed)
[facility]-EMERGENCY DEPT Provider Note   CSN: [id number] Arrival date & time: 11/16/21  1558     History  Chief Complaint  Patient presents with   Dizziness    Ariana Scott is a 26 y.o. female.   Dizziness   Pt had a severe dizzy spell last night.  She felt like the room was spinning and she was spinning.  Her vision changed and she had trouble with her speech.  That lasted for 20 minutes.  She did feel some palpitations as well.  She went for a jog today and then felt her leg ache and then she felt numbness on the right side.  Home Medications Prior to Admission medications   Medication Sig Start Date End Date Taking? Authorizing Provider  Azelaic Acid 15 % cream Apply on the entire face daily after cleansing 11/05/19   [provider]  cholecalciferol (VITAMIN D3) 25 MCG (1000 UNIT) tablet Take 2,000 Units by mouth every other day. 04/21/21   [provider]  clobetasol ointment (TEMOVATE) 0.05 % daily as needed. 12/04/18   [provider]  hydroxychloroquine (PLAQUENIL) 200 MG tablet TAKE 1 TABLET BY MOUTH TWICE A DAY 09/14/21   Gearldine Bienenstock, PA-C  levothyroxine (SYNTHROID) 50 MCG tablet Take 1.5 tablets (75 mcg total) by mouth daily before breakfast. 10/15/21   Carlus Pavlov, MD      Allergies    Kiwi extract and Pineapple    Review of Systems   Review of Systems  Constitutional:  Negative for fever.  Neurological:  Positive for dizziness.   Physical Exam Updated Vital Signs BP (!) 134/92    Pulse 73    Temp 98.5 F (36.9 C) (Oral)    Resp 20    Ht 1.664 m (5' 5.5")    Wt 95.3 kg    SpO2 98%    BMI 34.41 kg/m  Physical Exam Vitals and nursing note reviewed.  Constitutional:      General: She is not in acute distress.    Appearance: She is well-developed.  HENT:     Head: Normocephalic and atraumatic.     Right Ear: External ear normal.     Left Ear: External ear normal.  Eyes:     General: No scleral icterus.        Right eye: No discharge.        Left eye: No discharge.     Conjunctiva/sclera: Conjunctivae normal.  Neck:     Trachea: No tracheal deviation.  Cardiovascular:     Rate and Rhythm: Normal rate and regular rhythm.  Pulmonary:     Effort: Pulmonary effort is normal. No respiratory distress.     Breath sounds: Normal breath sounds. No stridor. No wheezing or rales.  Abdominal:     General: Bowel sounds are normal. There is no distension.     Palpations: Abdomen is soft.     Tenderness: There is no abdominal tenderness. There is no guarding or rebound.  Musculoskeletal:        General: No tenderness.     Cervical back: Neck supple.  Skin:    General: Skin is warm and dry.     Findings: No rash.  Neurological:     Mental Status: She is alert and oriented to person, place, and time.     Cranial Nerves: No cranial nerve deficit.     Sensory: No sensory deficit.     Motor: No abnormal muscle tone or seizure activity.  Coordination: Coordination normal.     Comments: No pronator drift bilateral upper extrem, able to hold both legs off bed for 5 seconds, sensation intact in all extremities, no visual field cuts, no left or right sided neglect, normal finger-nose exam bilaterally, no nystagmus noted  No facial droop, extraocular movements intact, tongue midline    ED Results / Procedures / Treatments   Labs (all labs ordered are listed, but only abnormal results are displayed) Labs Reviewed  BASIC METABOLIC PANEL - Abnormal; Notable for the following components:      Result Value   CO2 21 (*)    All other components within normal limits  CBC - Abnormal; Notable for the following components:   RBC 5.54 (*)    Hemoglobin 16.3 (*)    HCT 47.8 (*)    All other components within normal limits  URINALYSIS, ROUTINE W REFLEX MICROSCOPIC - Abnormal; Notable for the following components:   Leukocytes,Ua TRACE (*)    Bacteria, UA RARE (*)    All other components within normal  limits  PREGNANCY, URINE  CBG MONITORING, ED    EKG EKG Interpretation  Date/Time:  Tuesday November 16 2021 16:10:42 EST Ventricular Rate:  89 PR Interval:  171 QRS Duration: 84 QT Interval:  349 QTC Calculation: 425 R Axis:   58 Text Interpretation: Sinus rhythm Low voltage, precordial leads Confirmed by Linwood Dibbles [phone]) on 11/16/2021 11:16:58 PM  Radiology MR BRAIN WO CONTRAST  Result Date: 11/16/2021 CLINICAL DATA:  Initial evaluation for neuro deficit, stroke suspected. EXAM: MRI HEAD WITHOUT CONTRAST TECHNIQUE: Multiplanar, multiecho pulse sequences of the brain and surrounding structures were obtained without intravenous contrast. COMPARISON:  Prior head CT from 05/04/2007. FINDINGS: Brain: Cerebral volume within normal limits. Minimal hazy and patchy T2/FLAIR signal abnormality noted within the posterior periventricular white matter, most pronounced at the right periatrial region (series 11, image 23). Findings are nonspecific, but overall mild in nature. No abnormal foci of restricted diffusion to suggest acute or subacute ischemia. Gray-white matter differentiation maintained. No encephalomalacia to suggest chronic cortical infarction or other insult. No acute or chronic intracranial blood products. No mass lesion, midline shift or mass effect. Ventricles normal size without hydrocephalus. No extra-axial fluid collection. Pituitary gland suprasellar region normal. Midline structures intact. Vascular: Major intracranial vascular flow voids are maintained. Skull and upper cervical spine: Craniocervical junction normal. Bone marrow signal intensity within normal limits. No scalp soft tissue abnormality. Sinuses/Orbits: Globes and orbital soft tissues within normal limits. Mild scattered mucosal thickening noted about the ethmoidal air cells. Paranasal sinuses are otherwise clear. No mastoid effusion. Inner ear structures grossly normal. Other: None. IMPRESSION: 1. No acute intracranial  abnormality. 2. Mild hazy and patchy T2/FLAIR signal abnormality involving the periventricular white matter, indeterminate. 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 Possible demyelinating disease would be the primary differential consideration in a patient of this age, although appearance would be atypical for this etiology. 3. Otherwise normal brain MRI. Electronically Signed   By: Rise Mu M.D.   On: 11/16/2021 23:00    Procedures Procedures    Medications Ordered in ED Medications - No data to display  ED Course/ Medical Decision Making/ A&P Clinical Course as of 11/16/21 2326  Tue Nov 16, 2021  2215 Basic metabolic panel(!) [JK]  2216 Normal [JK]  2216 CBC(!) Normal [JK]  2216 Pregnancy, urine Negative [JK]  2216 Urinalysis, Routine w reflex microscopic Urine, Clean Catch(!) Does not suggest  infection [JK]    Clinical Course User Index [JK] Linwood Dibbles, MD                           Medical Decision Making Amount and/or Complexity of Data Reviewed Labs: ordered. Decision-making details documented in ED Course. Radiology: ordered.   Patient presented to the ED for evaluation of dizziness.  No signs of anemia or dehydration.  No electrolyte abnormalities.  Patient is not pregnant.  No focal deficits noted on exam but with the patient's complaints of numbness and weakness and visual disturbances MRI was performed to evaluate for any acute abnormalities.  Fortunately no signs of acute stroke or tumor.  Nonspecific findings noted on MRI.  Discussed these with the patient and will have her follow-up with neurology as an outpatient.  At this time no indications for hospitalization.  Evaluation and diagnostic testing in the emergency department does not suggest an emergent condition requiring admission or immediate intervention beyond what has been performed at this time.  The patient is safe for  discharge and has been instructed to return immediately for worsening symptoms, change in symptoms or any other concerns.         Final Clinical Impression(s) / ED Diagnoses Final diagnoses:  Dizziness    Rx / DC Orders ED Discharge Orders     None         Linwood Dibbles, MD 11/16/21 2326

## 2021-11-16 NOTE — ED Notes (Signed)
Patient transported to MRI 

## 2021-11-16 NOTE — Discharge Instructions (Signed)
Consider following up with neurologist for further evaluation and to review your MRI findings as we discssued ?

## 2021-11-18 ENCOUNTER — Encounter: Payer: Self-pay | Admitting: Neurology

## 2021-11-18 ENCOUNTER — Encounter: Payer: Self-pay | Admitting: *Deleted

## 2021-11-18 ENCOUNTER — Ambulatory Visit: Payer: 59 | Admitting: Neurology

## 2021-11-18 VITALS — BP 125/82 | HR 71 | Ht 65.5 in | Wt 210.5 lb

## 2021-11-18 DIAGNOSIS — R9389 Abnormal findings on diagnostic imaging of other specified body structures: Secondary | ICD-10-CM | POA: Diagnosis not present

## 2021-11-18 DIAGNOSIS — H539 Unspecified visual disturbance: Secondary | ICD-10-CM

## 2021-11-18 DIAGNOSIS — G43709 Chronic migraine without aura, not intractable, without status migrainosus: Secondary | ICD-10-CM

## 2021-11-18 MED ORDER — SUMATRIPTAN SUCCINATE 50 MG PO TABS: ORAL_TABLET | ORAL | 11 refills | Status: DC

## 2021-11-18 NOTE — Progress Notes (Signed)
? ?Chief Complaint  ?Patient presents with  ? Hospitalization Follow-up  ?  Rm 15. Alone. NO PCP. ?ED referral for dizziness. C/o numbness in hip and thigh, migrating to top of ankle and foot. Before dizziness entire foot was numb.  ? ? ? ? ?ASSESSMENT AND PLAN ? ?Ariana Scott is a 26 y.o. female   ?Chronic migraine headaches ? Worsening headaches, overall with blurry vision, her BMI is 34.5, ? Will refer to ophthalmologist to rule out intracranial hypertension ? Imitrex as needed for headache control ? ? ?DIAGNOSTIC DATA (LABS, IMAGING, TESTING) ?- I reviewed patient records, labs, notes, testing and imaging myself where available. ?Laboratory evaluation showed elevated hemoglobin of 16.3, hematocrit of 40.7, it was noticeable elevated since 2022 normal CMP urinalysis was within normal limit ? ?MEDICAL HISTORY: ? ?Ariana Scott, is a 26 year old female, seen in request by emergency room for evaluation of dizziness, numbness in  lower extremity, initial evaluation was on November 18, 2021. ? ?I reviewed and summarized the referring note. PMHx. ?Depression, axniety ?Autoimmune disease, on plaquenil, anit-Ds DNA ?hypothyrodism ? ?Patient went to emergency room November 16, 2021, complaint sudden onset of dizziness, she complains that she felt like her body in the room was spinning, lasted for 20 minutes, no hearing loss, no headache ? ?She did reported a history of migraine headaches, often preceded by visual aura, flashing color, blurry vision before the onset of the headache, headache can be so severe, she can pass out from severe headaches, does have nausea with headache ? ?She no longer have severe headache described above, but 2 to 3 times each week, she continue has mild to moderate headaches, sometimes associated with visual distortion, ? ? ?PHYSICAL EXAM: ?  ?Vitals:  ? 11/18/21 1049  ?BP: 125/82  ?Pulse: 71  ?Weight: 210 lb 8 oz (95.5 kg)  ?Height: 5' 5.5" (1.664 m)  ? ?Not recorded ?  ? ? ?Body mass index is  34.5 kg/m?. ? ?PHYSICAL EXAMNIATION: ? ?Gen: NAD, conversant, well nourised, well groomed                     ?Cardiovascular: Regular rate rhythm, no peripheral edema, warm, nontender. ?Eyes: Conjunctivae clear without exudates or hemorrhage ?Neck: Supple, no carotid bruits. ?Pulmonary: Clear to auscultation bilaterally  ? ?NEUROLOGICAL EXAM: ? ?MENTAL STATUS: ?Speech: ?   Speech is normal; fluent and spontaneous with normal comprehension.  ?Cognition: ?    Orientation to time, place and person ?    Normal recent and remote memory ?    Normal Attention span and concentration ?    Normal Language, naming, repeating,spontaneous speech ?    Fund of knowledge ?  ?CRANIAL NERVES: ?CN II: Visual fields are full to confrontation. Pupils are round equal and briskly reactive to light. ?CN III, IV, VI: extraocular movement are normal. No ptosis. ?CN V: Facial sensation is intact to light touch ?CN VII: Face is symmetric with normal eye closure  ?CN VIII: Hearing is normal to causal conversation. ?CN IX, X: Phonation is normal. ?CN XI: Head turning and shoulder shrug are intact ? ?MOTOR: ?There is no pronator drift of out-stretched arms. Muscle bulk and tone are normal. Muscle strength is normal. ? ?REFLEXES: ?Reflexes are 2+ and symmetric at the biceps, triceps, knees, and ankles. Plantar responses are flexor. ? ?SENSORY: ?Intact to light touch, pinprick and vibratory sensation are intact in fingers and toes. ? ?COORDINATION: ?There is no trunk or limb dysmetria noted. ? ?GAIT/STANCE: ?Posture  is normal. Gait is steady with normal steps, base, arm swing, and turning. Heel and toe walking are normal. Tandem gait is normal.  ?Romberg is absent. ? ?REVIEW OF SYSTEMS:  ?Full 14 system review of systems performed and notable only for as above ?All other review of systems were negative. ? ? ?ALLERGIES: ?Allergies  ?Allergen Reactions  ? Kiwi Extract Itching  ? Pineapple Itching  ? ? ?HOME MEDICATIONS: ?Current Outpatient  Medications  ?Medication Sig Dispense Refill  ? Azelaic Acid 15 % cream Apply on the entire face daily after cleansing    ? cholecalciferol (VITAMIN D3) 25 MCG (1000 UNIT) tablet Take 2,000 Units by mouth every other day.    ? clobetasol ointment (TEMOVATE) 0.05 % daily as needed.    ? hydroxychloroquine (PLAQUENIL) 200 MG tablet TAKE 1 TABLET BY MOUTH TWICE A DAY 180 tablet 0  ? levothyroxine (SYNTHROID) 50 MCG tablet Take 1.5 tablets (75 mcg total) by mouth daily before breakfast. 135 tablet 3  ? ?No current facility-administered medications for this visit.  ? ? ?PAST MEDICAL HISTORY: ?Past Medical History:  ?Diagnosis Date  ? Autoimmune disease ([place])   ? Eczema   ? Erosive lichen planus of vulva   ? Hashimoto's disease   ? Hypothyroidism   ? IBS (irritable bowel syndrome)   ? Polycystic ovarian syndrome   ? ? ?PAST SURGICAL HISTORY: ?Past Surgical History:  ?Procedure Laterality Date  ? COLONOSCOPY  2020  ? Dr. Collene Mares  ? eyelid surgery Right 03/26/2021  ? upper eyelid surgery  ? UPPER GASTROINTESTINAL ENDOSCOPY  2020  ? Dr. Collene Mares  ? [place] EXTRACTION  2016  ? ? ?FAMILY HISTORY: ?Family History  ?Problem Relation Age of Onset  ? Diabetes Mother   ? Depression Mother   ? ADD / ADHD Mother   ? Depression Father   ? Vitamin D deficiency Father   ? Colitis Father   ? Hypertension Father   ? High Cholesterol Father   ? ADD / ADHD Brother   ? ? ?SOCIAL HISTORY: ?Social History  ? ?Socioeconomic History  ? Marital status: Single  ?  Spouse name: Not on file  ? Number of children: Not on file  ? Years of education: Not on file  ? Highest education level: Not on file  ?Occupational History  ? Not on file  ?Tobacco Use  ? Smoking status: Never  ?  Passive exposure: Past  ? Smokeless tobacco: Never  ?Vaping Use  ? Vaping Use: Never used  ?Substance and Sexual Activity  ? Alcohol use: Yes  ?  Comment: occ- once monthly   ? Drug use: Not Currently  ? Sexual activity: Not on file  ?Other Topics Concern  ? Not on file   ?Social History Narrative  ? Not on file  ? ?Social Determinants of Health  ? ?Financial Resource Strain: Not on file  ?Food Insecurity: Not on file  ?Transportation Needs: Not on file  ?Physical Activity: Not on file  ?Stress: Not on file  ?Social Connections: Not on file  ?Intimate Partner Violence: Not on file  ? ? ? ? ?Marcial Pacas, M.D. Ph.D. ? ?[facility] ?[address] ?[place], [place] [address] ?Ph: [phone]) [phone] ?Fax: [phone] ? ?CC:  No referring provider defined for this encounter.  Pcp, No   ?

## 2021-11-18 NOTE — Patient Instructions (Signed)
Magnesium oxide 400 mg plus riboflavin= vitamin B2 100 mg twice a day as migraine prevention ? ?Sumatriptan= Imitrex 50 mg as needed to moderate to severe headache, limit the use to less than twice each week, less than 2 tablets in 24 hours ? ?Aleve as needed ? ?Call office in March 2024 for MRI brain w/wo ?

## 2021-11-22 ENCOUNTER — Telehealth: Payer: Self-pay | Admitting: Neurology

## 2021-11-22 NOTE — Telephone Encounter (Signed)
Referral sent to [facility] [phone]. ?

## 2021-11-25 ENCOUNTER — Other Ambulatory Visit: Payer: Self-pay | Admitting: *Deleted

## 2021-11-25 MED ORDER — HYDROXYCHLOROQUINE SULFATE 200 MG PO TABS: 200.0000 mg | ORAL_TABLET | Freq: Two times a day (BID) | ORAL | 0 refills | Status: DC

## 2021-11-25 NOTE — Telephone Encounter (Signed)
Next Visit: 03/11/2022 ? ?Last Visit: 11/05/2021 ? ?Labs: 09/24/2021 ? ?Eye exam: 06/29/2021 WNL   ? ?Current Dose per office note 11/05/2021: Plaquenil 200 mg 1 tablet by mouth twice daily ? ?[id number] disease  ? ?Last Fill: 09/14/2021 ? ?Okay to refill Plaquenil?  ?

## 2022-02-24 NOTE — Progress Notes (Signed)
Office Visit Note  Patient: Ariana Scott             Date of Birth: 1996/06/02           MRN: [id number]             PCP: Pcp, No Referring: Mancel Bale, PA-C Visit Date: 02/25/2022 Occupation: @GUAROCC @  Subjective:  Medication monitoring   History of Present Illness: Ariana Scott is a 26 y.o. female with history of autoimmune disease.  She is taking Plaquenil 200 mg 1 tablet by mouth twice daily started in September 2020.  She is tolerating plaquenil without any side effects.  Patient reports that she had an eye examination yesterday [facility] and will have all records forwarded to our office.  She states that she has been undergoing a thorough work-up for her migraines.  She had MRI of the brain on 11/16/2021 and was also evaluated by neurology.  She plans on continuing to follow-up at [facility]. Her energy level has been stable.  She has occasional discomfort in the left knee joint which is exacerbated by strenuous physical activity.  She has not had any joint swelling.  She denies any recent rashes but has ongoing photosensitivity.  She has been wearing sunscreen on a daily basis as well as a hat if she plans on being outdoors.  She will be starting a new job in [place] for which she will be outdoors for prolonged periods of time.  She does not currently have insurance. She denies any symptoms of Raynaud's.  She has not had any oral or nasal ulcerations.  She has ongoing sicca symptoms which have been tolerable overall.  She denies any shortness of breath, pleuritic chest pain, or palpitations.     Activities of Daily Living:  Patient reports morning stiffness for 0 minutes  Patient Denies nocturnal pain.  Difficulty dressing/grooming: Denies Difficulty climbing stairs: Denies Difficulty getting out of chair: Denies Difficulty using hands for taps, buttons, cutlery, and/or writing: Denies  Review of Systems  Constitutional:  Negative for fatigue.  HENT:  Positive  for mouth dryness. Negative for mouth sores and nose dryness.   Eyes:  Positive for dryness. Negative for pain and visual disturbance.  Respiratory:  Negative for cough, hemoptysis, shortness of breath and difficulty breathing.   Cardiovascular:  Negative for chest pain, palpitations, hypertension and swelling in legs/feet.  Gastrointestinal:  Negative for blood in stool, constipation and diarrhea.  Endocrine: Positive for excessive thirst. Negative for increased urination.  Genitourinary:  Negative for difficulty urinating and painful urination.  Musculoskeletal:  Positive for joint pain and joint pain. Negative for joint swelling, myalgias, muscle weakness, morning stiffness, muscle tenderness and myalgias.  Skin:  Positive for sensitivity to sunlight. Negative for color change, pallor, rash, hair loss, nodules/bumps, skin tightness and ulcers.  Allergic/Immunologic: Negative for susceptible to infections.  Neurological:  Negative for dizziness, numbness, headaches and weakness.  Hematological:  Negative for bruising/bleeding tendency and swollen glands.  Psychiatric/Behavioral:  Negative for depressed mood and sleep disturbance. The patient is not nervous/anxious.     PMFS History:  Patient Active Problem List   Diagnosis Date Noted   Abnormal MRI 11/18/2021   Vision changes 11/18/2021   Chronic migraine w/o aura w/o status migrainosus, not intractable 11/18/2021   Hypothyroidism due to Hashimoto's thyroiditis 03/24/2020   Hair loss 08/13/2019   Chronic midline low back pain without sciatica 07/08/2019   Elevated LFTs 07/08/2019   Fatty liver  07/08/2019   Autoimmune disease ([place]) 07/08/2019   Anxiety 26/37/8588   Lichen planus 50/27/7412   Other acne 05/01/2019   Hypothyroidism (acquired) 01/24/2019   Dry skin 87/86/7672   Erosive lichen planus of vulva 09/13/2015   Diarrhea 09/12/2014   Cervical sprain 09/11/2013    Past Medical History:  Diagnosis Date   Autoimmune disease  ([place])    Eczema    Erosive lichen planus of vulva    Hashimoto's disease    Hypothyroidism    IBS (irritable bowel syndrome)    Migraines    Polycystic ovarian syndrome     Family History  Problem Relation Age of Onset   Diabetes Mother    Depression Mother    ADD / ADHD Mother    Depression Father    Vitamin D deficiency Father    Colitis Father    Hypertension Father    High Cholesterol Father    ADD / ADHD Brother    Past Surgical History:  Procedure Laterality Date   COLONOSCOPY  2020   Dr. Collene Mares   eyelid surgery Right 03/26/2021   upper eyelid surgery   UPPER GASTROINTESTINAL ENDOSCOPY  2020   Dr. Collene Mares   WISDOM TOOTH EXTRACTION  2016   Social History   Social History Narrative   Not on file   Immunization History  Administered Date(s) Administered   DTaP 10/16/1995, 11/29/1995, 01/11/1996, 01/10/1997, 04/28/2000   HPV Quadrivalent 05/21/2007, 07/03/2008   Hepatitis A 05/21/2007, 07/03/2008   Hepatitis B 06/12/95, 08/01/1995, 01/11/1996   HiB (PRP-OMP) 10/16/1995, 11/29/1995, 01/11/1996, 01/10/1997   IPV 10/16/1995, 11/29/1995, 01/11/1996, 04/28/2000   MMR 07/02/1996, 04/28/2000   Meningococcal Conjugate 05/21/2007   PFIZER(Purple Top)SARS-COV-2 Vaccination 12/07/2019, 12/31/2019, 08/18/2020   Tdap 04/23/2007     Objective: Vital Signs: BP 119/84 (BP Location: Left Arm, Patient Position: Sitting, Cuff Size: Small)   Pulse 75   Resp 12   Ht 5' 5"  (1.651 m)   Wt 216 lb 9.6 oz (98.2 kg)   BMI 36.04 kg/m    Physical Exam Vitals and nursing note reviewed.  Constitutional:      Appearance: She is well-developed.  HENT:     Head: Normocephalic and atraumatic.  Eyes:     Conjunctiva/sclera: Conjunctivae normal.  Cardiovascular:     Rate and Rhythm: Normal rate and regular rhythm.     Heart sounds: Normal heart sounds.  Pulmonary:     Effort: Pulmonary effort is normal.     Breath sounds: Normal breath sounds.  Abdominal:     General: Bowel sounds  are normal.     Palpations: Abdomen is soft.  Musculoskeletal:     Cervical back: Normal range of motion.  Skin:    General: Skin is warm and dry.     Capillary Refill: Capillary refill takes less than 2 seconds.  Neurological:     Mental Status: She is alert and oriented to person, place, and time.  Psychiatric:        Behavior: Behavior normal.      Musculoskeletal Exam: C-spine, thoracic spine, lumbar spine have good range of motion.  No midline spinal tenderness or SI joint tenderness at this time.  Shoulder joints, elbow joints, wrist joints, MCPs, PIPs, DIPs have good range of motion with no synovitis.  Complete fist formation bilaterally.  Hip joints have good range of motion with no groin pain.  Knee joints have good range of motion with no warmth or effusion.  Ankle joints have good range of motion  with no tenderness or joint swelling.  CDAI Exam: CDAI Score: -- Patient Global: --; Provider Global: -- Swollen: --; Tender: -- Joint Exam 02/25/2022   No joint exam has been documented for this visit   There is currently no information documented on the homunculus. Go to the Rheumatology activity and complete the homunculus joint exam.  Investigation: No additional findings.  Imaging: No results found.  Recent Labs: Lab Results  Component Value Date   WBC 6.5 11/16/2021   HGB 16.3 (H) 11/16/2021   PLT 229 11/16/2021   NA 135 11/16/2021   K 4.0 11/16/2021   CL 107 11/16/2021   CO2 21 (L) 11/16/2021   GLUCOSE 94 11/16/2021   BUN 9 11/16/2021   CREATININE 0.63 11/16/2021   BILITOT 0.6 09/24/2021   AST 21 09/24/2021   ALT 11 09/24/2021   PROT 7.8 09/24/2021   CALCIUM 9.2 11/16/2021   GFRAA 133 03/01/2021    Speciality Comments: PLQ eye exam: 06/29/2021 [facility]. Follow up in 12 months.  Procedures:  No procedures performed Allergies: Kiwi extract and Pineapple      Assessment / Plan:     Visit Diagnoses: Autoimmune disease ([place]) - Positive ANA,  double-stranded DNA, positive Ro, history of photosensitivity, facial rash and elevated LFTs:  She is not exhibiting any signs or symptoms of a flare at this time.  She has clinically been doing well taking Plaquenil 200 mg 1 tablet by mouth twice daily.  She continues to tolerate Plaquenil without any side effects and has not missed any doses recently.  She has not had any oral or nasal ulcerations, Raynaud's phenomenon, shortness of breath, pleuritic chest pain, synovitis, or malar rash.  She has ongoing photosensitivity.  Discussed the importance of wearing sunscreen SPF greater than 50 on a daily basis as well as wearing sun protective clothing and a hat.  Overall her sicca symptoms have been tolerable with over-the-counter products. She has not been experiencing any increased joint pain or myalgias.  She had no synovitis on examination today. Lab work from 09/24/2021 was reviewed today in the office: Complements within normal limits, ESR within normal limits, dsDNA 31 (stable).  The following lab work will be rechecked today.  The patient does not currently have insurance but will be starting a new job soon.  Future orders for the following labs were placed today.  Discussed that she will continue to require at least a CBC and CMP every 5 months to monitor for drug toxicity.  She voiced understanding.  She was advised to notify us if she develops any signs or symptoms of a flare.  She will follow up in 5 months or sooner if needed.  - Plan: CBC with Differential/Platelet, COMPLETE METABOLIC PANEL WITH GFR, Urinalysis, Routine w reflex microscopic, Anti-DNA antibody, double-stranded, C3 and C4, Sedimentation rate  High risk medication use - Plaquenil 200 mg 1 tablet by mouth twice daily started in September 2020.- Plan: CBC with Differential/Platelet, COMPLETE METABOLIC PANEL WITH GFR PLQ eye exam: 06/29/2021 [facility]. Evaluated by [facility] yesterday.  She will have records forwarded to our  office.  CBC and BMP drawn on 11/16/2021.  Future orders for CBC and CMP were placed today.  Elevated CK: CK within normal limits on 11/25/2019.  No muscle weakness at this time.  Elevated LFTs -LFTs within normal limits on 09/24/2021.  Plan: COMPLETE METABOLIC PANEL WITH GFR  Fatty liver: Right upper quadrant abdominal ultrasound performed on 04/02/2019.  LFTs within normal limits on 09/24/2021.  Eczema of both hands: No recent flare.   Other fatigue: Stable.   Chronic SI joint pain: She has no SI joint tenderness upon palpation today.   Other medical conditions are listed as follows:   Lichen planus  Anxiety and depression  Vitamin D deficiency: She is taking vitamin D 2000 units every other day.    History of hypothyroidism: She remains on synthroid as prescribed.   Hx of migraines: She has established care with Dr. Krista Blue and [facility] care.  She had a brain MRI on 11/16/21.    Orders: Orders Placed This Encounter  Procedures   CBC with Differential/Platelet   COMPLETE METABOLIC PANEL WITH GFR   Urinalysis, Routine w reflex microscopic   Anti-DNA antibody, double-stranded   C3 and C4   Sedimentation rate   No orders of the defined types were placed in this encounter.    Follow-Up Instructions: Return in about 5 months (around 07/28/2022) for Autoimmune Disease.   Ofilia Neas, PA-C  Note - This record has been created using Dragon software.  Chart creation errors have been sought, but may not always  have been located. Such creation errors do not reflect on  the standard of medical care.

## 2022-02-25 ENCOUNTER — Ambulatory Visit (INDEPENDENT_AMBULATORY_CARE_PROVIDER_SITE_OTHER): Payer: Self-pay | Admitting: Physician Assistant

## 2022-02-25 ENCOUNTER — Encounter: Payer: Self-pay | Admitting: Physician Assistant

## 2022-02-25 ENCOUNTER — Other Ambulatory Visit: Payer: Self-pay | Admitting: Rheumatology

## 2022-02-25 VITALS — BP 119/84 | HR 75 | Resp 12 | Ht 65.0 in | Wt 216.6 lb

## 2022-02-25 DIAGNOSIS — F32A Depression, unspecified: Secondary | ICD-10-CM

## 2022-02-25 DIAGNOSIS — Z79899 Other long term (current) drug therapy: Secondary | ICD-10-CM

## 2022-02-25 DIAGNOSIS — Z8669 Personal history of other diseases of the nervous system and sense organs: Secondary | ICD-10-CM

## 2022-02-25 DIAGNOSIS — Z8639 Personal history of other endocrine, nutritional and metabolic disease: Secondary | ICD-10-CM

## 2022-02-25 DIAGNOSIS — R7989 Other specified abnormal findings of blood chemistry: Secondary | ICD-10-CM

## 2022-02-25 DIAGNOSIS — L309 Dermatitis, unspecified: Secondary | ICD-10-CM

## 2022-02-25 DIAGNOSIS — F419 Anxiety disorder, unspecified: Secondary | ICD-10-CM

## 2022-02-25 DIAGNOSIS — R768 Other specified abnormal immunological findings in serum: Secondary | ICD-10-CM

## 2022-02-25 DIAGNOSIS — L439 Lichen planus, unspecified: Secondary | ICD-10-CM

## 2022-02-25 DIAGNOSIS — E559 Vitamin D deficiency, unspecified: Secondary | ICD-10-CM

## 2022-02-25 DIAGNOSIS — K760 Fatty (change of) liver, not elsewhere classified: Secondary | ICD-10-CM

## 2022-02-25 DIAGNOSIS — M533 Sacrococcygeal disorders, not elsewhere classified: Secondary | ICD-10-CM

## 2022-02-25 DIAGNOSIS — G8929 Other chronic pain: Secondary | ICD-10-CM

## 2022-02-25 DIAGNOSIS — R748 Abnormal levels of other serum enzymes: Secondary | ICD-10-CM

## 2022-02-25 DIAGNOSIS — R5383 Other fatigue: Secondary | ICD-10-CM

## 2022-02-25 DIAGNOSIS — M359 Systemic involvement of connective tissue, unspecified: Secondary | ICD-10-CM

## 2022-02-25 NOTE — Telephone Encounter (Signed)
Next Visit: 08/26/2022  Last Visit: 02/25/2022  Labs: 11/16/2021 RBC 5.54, Hgb 16.3, Hct 47.8, C02 21  Eye exam: 06/29/2021 [facility]    Current Dose per office note 02/25/2022: Plaquenil 200 mg 1 tablet by mouth twice daily   DX: Autoimmune disease   Last Fill: 11/25/2021  Okay to refill Plaquenil?

## 2022-03-01 ENCOUNTER — Telehealth: Payer: Self-pay | Admitting: Neurology

## 2022-03-01 NOTE — Telephone Encounter (Signed)
Ophthalmology evaluation from [facility] by PA Alma Downs on February 24, 2022 no disc edema, shallow cup, distinct optic nerve margin, bilateral temporal Peripapillary atrophy,

## 2022-03-10 ENCOUNTER — Ambulatory Visit: Payer: 59 | Admitting: Physician Assistant

## 2022-03-11 ENCOUNTER — Ambulatory Visit: Payer: 59 | Admitting: Physician Assistant

## 2022-04-17 ENCOUNTER — Encounter: Payer: Self-pay | Admitting: Rheumatology

## 2022-04-17 ENCOUNTER — Encounter: Payer: Self-pay | Admitting: Internal Medicine

## 2022-04-18 ENCOUNTER — Emergency Department
Admission: EM | Admit: 2022-04-18 | Discharge: 2022-04-18 | Disposition: A | Discharge status: Home / Self Care | Payer: BLUE CROSS/BLUE SHIELD | Attending: Emergency Medicine | Admitting: Emergency Medicine

## 2022-04-18 DIAGNOSIS — F410 Panic disorder [episodic paroxysmal anxiety] without agoraphobia: Secondary | ICD-10-CM | POA: Insufficient documentation

## 2022-04-18 DIAGNOSIS — F458 Other somatoform disorders: Secondary | ICD-10-CM | POA: Insufficient documentation

## 2022-04-18 DIAGNOSIS — R002 Palpitations: Secondary | ICD-10-CM | POA: Insufficient documentation

## 2022-04-18 DIAGNOSIS — R202 Paresthesia of skin: Secondary | ICD-10-CM | POA: Insufficient documentation

## 2022-04-18 DIAGNOSIS — F411 Generalized anxiety disorder: Secondary | ICD-10-CM

## 2022-04-18 LAB — ECG 12-LEAD
P Wave Axis: 30 deg
P-R Interval: 171 ms
Patient Age: 25 years
Q-T Interval(Corrected): 409 ms
Q-T Interval: 371 ms
QRS Axis: 15 deg
QRS Duration: 79 ms
T Axis: 35 years
Ventricular Rate: 73 //min

## 2022-04-18 MED ORDER — HYDROXYZINE PAMOATE 25 MG PO CAPS
25.0000 mg | ORAL_CAPSULE | Freq: Four times a day (QID) | ORAL | 0 refills | Status: DC | PRN
Start: 2022-04-18 — End: 2022-05-01

## 2022-04-18 NOTE — ED Notes (Signed)
Dr. Francene Finders saw EKG.

## 2022-04-18 NOTE — ED Provider Notes (Signed)
[employer], [employer] recognition software was used to generate this report. Every attempt was made at correction, however, word substitution, grammatical, and transcription errors may occur.     Event Date/Time    Physician/Midlevel provider first contact with patient: 04/18/22 [id number]         FINAL DIAGNOSIS:  1. Generalized anxiety disorder with panic attacks        2. Palpitations        3. Acute hyperventilation syndrome        4. Paresthesia               ASSESSMENT/DIFFERENTIAL DIAGNOSIS:  Cindy Rosales is a 26 y.o. female with paresthesias, numbness, carpopedal spasm, likely anxiety with panic and hyperventilation. No definitive evidence of ischemic, embolic, surgical, bacterial disease       DIFFERENTIAL DIAGNOSES that were considered in the evaluation of this patient include, but were not limited to, stroke, TIA, arrhythmia, coronary syndrome, anxiety, panic attack, hyperventilation, Lyme disease, ehrlichiosis, among others. -     ED COURSE & MEDICAL DECISION MAKING:    The patient remained stable throughout.  She has a benign examination.  There is no evidence of stroke.  She does not have evidence of a coronary syndrome.  She was interested in having an ECG to rule out any cardiac abnormality.  This returned essentially normal.  We had a long discussion about the effects of anxiety, the natural history of palpitations and hyperventilation effects.  I demonstrated tactical breathing to her.  I demonstrated paper bag rebreathing to her.  She will need close follow-up and a trial of hydroxyzine to help her with anxiety attack.  She was provided a number for follow-up.  She was given a work note.  She has a reassuring evaluation and I do not suspect any cardiac or cerebrovascular issues.  It is not unreasonable for her to have a Holter monitor in the future, but she does not need hospitalization or further management here today.   -            CONSULTATION:   None     DISPOSITION:    Discharge from the Emergency Department.      New Prescriptions    HYDROXYZINE (VISTARIL) 25 MG CAPSULE    Take 1 capsule (25 mg) by mouth 4 (four) times daily as needed for Anxiety       CONDITION AT DISPOSITION: Good, improved spontaneously. Stable.       Medical Decision Making  Amount and/or Complexity of Data Reviewed  ECG/medicine tests: ordered.    Risk  Prescription drug management.      '[]'$  Utilized independent historian  Who:   Reason:  '[]'$  To clarify the patient's past medical history and recent history  '[]'$  Other:   '[]'$  Review of external data (utilizing EPIC EMR, Care Everywhere EMR) to help clarify the patient's prior medical history, medical conditions, recent therapies, changes, risk factors '[]'$  Medical records:   '[]'$  Previous laboratory results:  '[]'$  Previous radiology results:  '[]'$  Previous ECG results:   '[]'$  Independent interpretation of laboratories    '[]'$  Independent preliminary interpretation of radiology results    '[x]'$  Independent preliminary interpretation of ECG to assess for ischemia, arrhythmia, electrolyte effects; review of prior ECG to assess for changes. ECG ordered to rule out arrhythmia, ischemia: No acute abnormalities   '[x]'$  Diagnosis and treatment significantly limited by social determinants of health '[]'$  Tobacco use  '[]'$   Alcohol/ substance abuse  '[]'$  Language barrier  '[]'$  Limited education  '[]'$  Limited literacy  '[]'$  Employment  '[]'$  Financial barriers to healthcare  '[]'$  Housing instability  '[]'$  Food insecurity  '[]'$  Social environment  '[]'$  Life events  '[]'$  Family and social support issues  '[x]'$  Psychosocial circumstances  '[]'$  Involvement with crime, violence, judicial system   '[x]'$  Risks '[]'$  OTC drugs  '[x]'$  Prescription drug management  '[]'$  Parenteral controlled substances  '[]'$  Drug therapy requiring intensive monitoring for toxicity  '[]'$  Decision regarding hospitalization  '[]'$  Decision not to resuscitate order to de-escalate care because of poor  prognosis  '[]'$ Emergency major surgery  '[]'$ Minor surgery with no identified risk factors   '[]'$  Discussion of assessment, management, and/or test interpretation with external provider(s):        Visit Vitals  BP (!) 124/97   Pulse 74   Temp 97.8 F (36.6 C) (Tympanic)   Resp 17   Ht 1.651 m   Wt 97.8 kg   LMP 03/29/2022 (Approximate)   SpO2 97%   BMI 35.86 kg/m     ED Course as of 04/18/22 1009   Mon Apr 18, 2022   0943 Reviewed and interpreted ECG.  No STEMI.   [KF]      ED Course User Index  [KF] Greggory Stallion, MD        RISK ASSESSMENT: The patient's records were checked through the [facility] (and other states where applicable and available), used in accordance with official notices posted in the Emergency Department and waiting room in accordance with law. This is to screen for misuse, overuse, doctor-shopping, and/or diversion. This is a patient safety issue.       Patient's evaluation indicates the patient does not appear to have a need for inpatient workup and/or management. No further testing indicated acutely. Prior visits/ charts: reviewed (ED visits, outpatient clinic visits, outside hospital visits, utilizing EPIC EMR, Care Everywhere EMR). Nurses' notes reviewed. Pertinent lab and radiology data was reviewed by me and discussed with patient and family, if present. I had a detailed discussion about the diagnosis, workup and treatment plan, and we are in agreement.    MEDICATIONS ORDERED THIS VISIT:   Medications - No data to display    PROCEDURES:   Procedures         HISTORIAN:  History obtained from chart review and the patient.  Historian(s) judged to be reliable.     CHIEF COMPLAINT:  Chief Complaint   Patient presents with    Numbness    Anxiety    "Numbness" according to patient    HISTORY OF PRESENT ILLNESS:    Cindy Rosales is a very pleasant, very anxious 26 y.o. female who complains of a sensation of numbness today, now resolved.  She tells me a story with pressured  speech of an apparent insect bite of the adjacent rash on the left posterior thigh over the past several weeks.  She was treated with clindamycin which did nothing, doxycycline which helped, then cefuroxime which helped.  However, she had "allergic reactions" where she felt tightness in her throat and felt her blood pressure going up.  She states that this morning she had driven to work and when she got there she started feeling some numbness in her right hand and arm and then her left hand and arm, then across her face and neck, then across her abdomen.  She demonstrates that she could not move her fourth and fifth fingers because  they were stuck together and flexed and she started feeling like something really bad was happening.  She reports that she has had palpitations and she gets these mostly at night.  Sometimes she gets the sensations of the tightness in her neck.  She does state that she has been told she was anxious and had generalized anxiety disorder by an urgent care in the past.  She does not have any exertional component.  No fevers.  No vomiting or diarrhea.  Sometimes she feels not somewhat short of breath but like she is not getting a deep breath, placing her hand on her chest to show pressure when she tells me this.  She shows me numerous photos of her bite and rash spanning several weeks of the course of this.  HPI -      .  REVIEW OF SYSTEMS:   At least 8 systems reviewed and negative, unless otherwise specified above.    PAST MEDICAL/SURGICAL HISTORY:  Past Medical History:   Diagnosis Date    Autoimmune disorder     Disorder of thyroid     Elevated LFTs     IBS (irritable bowel syndrome)     PCOS (polycystic ovarian syndrome)    .   History reviewed. No pertinent surgical history..     IMMUNIZATIONS:    Immunization History   Administered Date(s) Administered    COVID-19 mRNA MONOVALENT vaccine PRIMARY SERIES 12 years and above Therapist, music) 30 mcg/0.3 mL (DILUTE BEFORE USE) 12/07/2019, 12/31/2019         FAMILY HISTORY:  Family History   Problem Relation Age of Onset    Diabetes Mother     Hypertension Father        HOME MEDICATIONS:  Home Medications       Med List Status: Complete Set By: Aline Brochure, RN at 04/18/2022  8:03 AM              hydroxychloroquine (PLAQUENIL) 200 MG tablet     Take 1 tablet (200 mg) by mouth 2 (two) times daily     levothyroxine (SYNTHROID) 75 MCG tablet     Take 1 tablet (75 mcg) by mouth Once a day at 6:00am     VITAMIN D PO     Take by mouth             ALLERGY:  Allergies   Allergen Reactions    Clindamycin     Doxycycline        SOCIAL HISTORY:   Social History     Tobacco Use    Smoking status: Never    Smokeless tobacco: Never   Vaping Use    Vaping Use: Never used   Substance Use Topics    Alcohol use: Yes     Comment: 04-18-22: occasional    Drug use: Never       NOTE: If the PMH or PSH or social history sections read "Not on file" or are left blank it reflects that the histories were verbally investigated but there is no past medical or surgical history, or tobacco, alcohol or recreational drug use except as noted.    PHYSICAL EXAMINATION:  VITAL SIGNS reviewed by me:   Vitals:    04/18/22 0751   BP: (!) 124/97   Pulse: 74   Resp: 17   Temp: 97.8 F (36.6 C)   SpO2: 97%      Reviewed by the Emergency Physician  Pulse Ox Interpretation by Emergency Physician: normal  WEIGHT:       04/18/2022     7:51 AM   Weight Monitoring   Height 165.1 cm   Height Method Stated   Weight 97.75 kg   Weight Method Bed Scale   BMI (calculated) 35.9 kg/m2     CONSTITUTIONAL: Vital signs reviewed. Patient is well-appearing. Patient appears uncomfortable and anxious. Hydration: normal. Not toxic appearing.  HEAD: Atraumatic. Normocephalic.  EYES: Eyes are normal to inspection. Pupils equal, round, reactive to light. Discharge from eyes: none. Extraocular muscles: intact.  ENT: No facial swelling. External ears normal. No discharge from ears.  Mucosa is pink and moist.  NECK: Range of  motion: normal. Tenderness: none. Trachea midline. Jugular venous distention: none.  CARDIOVASCULAR: Rate: normal. Rhythm: Regular. Murmurs: none. Normal S1 S2.  RESPIRATORY/CHEST: Chest tenderness: none. Breath sounds: normal. Respiratory distress: none. Accessory muscle use: none. Work of breathing: normal.  BACK: Tenderness: none. Range of motion: normal for age. Visible abnormality: none. Palpable abnormality: none.  ABDOMEN: Soft. Distention: none. Tenderness: none. No guarding. No rebound. Bowel sounds: normal.  RECTAL: Not performed.  GENITALIA: Not examined.  UPPER EXTREMITY: No edema. No cyanosis. No clubbing. Normal range of motion.  LOWER EXTREMITY: No edema. No cyanosis. No clubbing. Normal range of motion. No calf tenderness.  NEUROLOGIC:  Awake. Alert. Strength: normal. Sensation: normal. Cranial nerves: intact. Conversation: fluent.  SKIN: Skin is normal color, warm, and dry. Skin intact. Rash: None.  LYMPHATIC: No pathologic adenopathy. No lymphangitis.  PSYCHIATRIC: Affect: Remarkably anxious with pressured speech. Insight: normal.  Physical Exam       DATA REVIEWED:      EKG:  Last EKG Result       Procedure Component Value Units Date/Time    ECG 12 lead ([id number] Collected: 04/18/22 0918     Updated: 04/18/22 0921     Patient Age 15 years      Patient DOB April 13, 1996     Patient Height --     Patient Weight --     Interpretation Text --     Sinus rhythm  Low voltage, precordial leads       Physician Interpreter --     Ventricular Rate 73 //min      QRS Duration 79 ms      P-R Interval 171 ms      Q-T Interval 371 ms      Q-T Interval(Corrected) 409 ms      P Wave Axis 30 deg      QRS Axis 15 deg      T Axis 35 years             Agree with above interpretation.   Interpreted by the Emergency Physician.           Greggory Stallion, MD  04/18/22 1009

## 2022-04-18 NOTE — Discharge Instructions (Addendum)
Tactical breathing technique     To help control stress and anxiety, use a SLOW tactical breathing technique.  This gives you something to focus on that will help get your anxiety back in control.  To do this, move yourself away from any other stimulant such as television, noisy environment, or other people.  You will have to focus on your breathing to do this and that will help you feel better.  You should BREATHE IN for a count of 3, HOLD YOUR BREATH IN for a count of 3, BREATHE OUT for a count of 3, HOLD YOUR BREATH OUT for count of 3 and then REPEAT the cycle as many times as you need.  You should breathe SLOWLY so you can breathe for the entire count of 3 and you can hold your breath for the entire count of 3.  Do this for as long as it takes for you to feel that your anxiety is settling back down again.  You can use this anytime at any place.    Paper bag rebreathing technique     To help control hyperventilation symptoms (numbness, tingling, drawing up of muscles, etc.) use a paper lunch bag to help rebreathing your CO2.  Place a paper lunch bag over your nose and mouth, holding it tightly to your face and breathe in and out through the bag.  The bag must shrink up and crackle while you do this.  It does not work if the air leaks out around the bag.  You cannot suffocate with it, and so you will have to trust the breathing technique.  This will help reverse the effects of hyperventilation.  Keep a stack of paper bags in your glove compartment, luggage, etc. so you always have some available to you.  You can fold when up and keep it in a pocket if you need to.    Follow-up information:     Recommend that you make a follow-up with a primary care provider.  Call as soon as possible to arrange the appointment: [phone].

## 2022-04-18 NOTE — ED Notes (Signed)
Pt here for c/o numbness in bilateral hands, abdominal numbness, and facial numbness. Pt states that began this morning and she believes it is related to an abx she stopped early (Ceftin). Last dose was Saturday AM. She has been on 3 abx "all of which I have been allergic to "for a rash on my left leg". The first 2 were clindamycin and doxy. Pt appears visibily anxious. She is no longer numb at time of triage.

## 2022-04-18 NOTE — ED Notes (Signed)
04/18/22 0833   Neurological   Neuro (WDL) WDL  (All numbness resolved by time patient was triaged.)

## 2022-04-18 NOTE — Telephone Encounter (Signed)
Autoimmune disease should not interfere with the response to antibiotics.  Patient may want to see an allergist.

## 2022-04-19 NOTE — ED Notes (Signed)
Pt verbalized understanding of all discharge instructions and education.

## 2022-05-01 ENCOUNTER — Emergency Department
Admission: EM | Admit: 2022-05-01 | Discharge: 2022-05-01 | Disposition: A | Discharge status: Home / Self Care | Payer: BLUE CROSS/BLUE SHIELD | Attending: Family Medicine | Admitting: Family Medicine

## 2022-05-01 ENCOUNTER — Emergency Department: Payer: BLUE CROSS/BLUE SHIELD

## 2022-05-01 DIAGNOSIS — R0989 Other specified symptoms and signs involving the circulatory and respiratory systems: Secondary | ICD-10-CM | POA: Insufficient documentation

## 2022-05-01 DIAGNOSIS — R0789 Other chest pain: Secondary | ICD-10-CM | POA: Insufficient documentation

## 2022-05-01 DIAGNOSIS — R030 Elevated blood-pressure reading, without diagnosis of hypertension: Secondary | ICD-10-CM | POA: Insufficient documentation

## 2022-05-01 DIAGNOSIS — R002 Palpitations: Secondary | ICD-10-CM | POA: Insufficient documentation

## 2022-05-01 LAB — MAGNESIUM: Magnesium: 1.9 mg/dL (ref 1.6–2.6)

## 2022-05-01 LAB — COMPREHENSIVE METABOLIC PANEL
ALT: 14 U/L (ref 0–55)
AST (SGOT): 20 U/L (ref 10–42)
Albumin/Globulin Ratio: 1.18 Ratio (ref 0.80–2.00)
Albumin: 3.9 gm/dL (ref 3.5–5.0)
Alkaline Phosphatase: 36 U/L — ABNORMAL LOW (ref 40–145)
Anion Gap: 15.5 mMol/L (ref 7.0–18.0)
BUN / Creatinine Ratio: 9.9 Ratio — ABNORMAL LOW (ref 10.0–30.0)
BUN: 7 mg/dL (ref 7–22)
Bilirubin, Total: 0.5 mg/dL (ref 0.1–1.2)
CO2: 20 mMol/L (ref 20–30)
Calcium: 9.4 mg/dL (ref 8.5–10.5)
Chloride: 107 mMol/L (ref 98–110)
Creatinine: 0.71 mg/dL (ref 0.60–1.20)
EGFR: 120 mL/min/{1.73_m2} (ref 60–150)
Globulin: 3.3 gm/dL (ref 2.0–4.0)
Glucose: 91 mg/dL (ref 71–99)
Osmolality Calculated: 275 mOsm/kg (ref 275–300)
Potassium: 3.5 mMol/L (ref 3.5–5.3)
Protein, Total: 7.2 gm/dL (ref 6.0–8.3)
Sodium: 139 mMol/L (ref 136–147)

## 2022-05-01 LAB — CBC AND DIFFERENTIAL
Basophils %: 1.2 % (ref 0.0–3.0)
Basophils Absolute: 0.1 10*3/uL (ref 0.0–0.3)
Eosinophils %: 1.2 % (ref 0.0–7.0)
Eosinophils Absolute: 0.1 10*3/uL (ref 0.0–0.8)
Hematocrit: 47.5 % (ref 36.0–48.0)
Hemoglobin: 16 gm/dL (ref 12.0–16.0)
Lymphocytes Absolute: 2.3 10*3/uL (ref 0.6–5.1)
Lymphocytes: 34.7 % (ref 15.0–46.0)
MCH: 30 pg (ref 28–35)
MCHC: 34 gm/dL (ref 31–36)
MCV: 88 fL (ref 80–100)
MPV: 6.3 fL (ref 6.0–10.0)
Monocytes Absolute: 0.8 10*3/uL (ref 0.1–1.7)
Monocytes: 11.7 % (ref 3.0–15.0)
Neutrophils %: 51.2 % (ref 42.0–78.0)
Neutrophils Absolute: 3.4 10*3/uL (ref 1.7–8.6)
PLT CT: 257 10*3/uL (ref 130–440)
RBC: 5.4 10*6/uL — ABNORMAL HIGH (ref 3.80–5.00)
RDW: 11.7 % (ref 10.5–14.5)
WBC: 6.6 10*3/uL (ref 4.0–11.0)

## 2022-05-01 LAB — PT AND APTT
PT INR: 1 (ref 0.9–1.2)
PT: 11.4 s — ABNORMAL HIGH (ref 9.4–11.3)
aPTT: 27.3 s (ref 24.0–33.0)

## 2022-05-01 LAB — ECG 12-LEAD
P Wave Axis: 19 deg
P-R Interval: 191 ms
Patient Age: 25 years
Q-T Interval(Corrected): 425 ms
Q-T Interval: 399 ms
QRS Axis: 1 deg
QRS Duration: 98 ms
T Axis: 13 years
Ventricular Rate: 68 //min

## 2022-05-01 LAB — SEDIMENTATION RATE: Sed Rate: 15 mm/hr (ref 0–22)

## 2022-05-01 LAB — C-REACTIVE PROTEIN: C-Reactive Protein: 0.11 mg/dL (ref 0.02–0.80)

## 2022-05-01 LAB — THYROID STIMULATING HORMONE (TSH), REFLEX ON ABNORMAL TO FREE T4, SERUM: TSH (Reflex Free T4 if Indicated): 3.02 u[IU]/mL (ref 0.40–4.20)

## 2022-05-01 LAB — HCG, SERUM, QUALITATIVE: BHCG Qualitative: NEGATIVE

## 2022-05-01 LAB — TROPONIN I: Troponin I: 0 ng/mL (ref 0.00–0.02)

## 2022-05-01 MED ORDER — PROPRANOLOL HCL 10 MG PO TABS
10.0000 mg | ORAL_TABLET | Freq: Once | ORAL | Status: AC
Start: 2022-05-01 — End: 2022-05-01
  Administered 2022-05-01: 10 mg via ORAL

## 2022-05-01 MED ORDER — PROPRANOLOL HCL 10 MG PO TABS
ORAL_TABLET | ORAL | Status: AC
Start: 2022-05-01 — End: ?
  Filled 2022-05-01: qty 1

## 2022-05-01 MED ORDER — PROPRANOLOL HCL 10 MG PO TABS
10.0000 mg | ORAL_TABLET | Freq: Three times a day (TID) | ORAL | 0 refills | Status: AC | PRN
Start: 2022-05-01 — End: ?

## 2022-05-01 MED ORDER — ASPIRIN 81 MG PO CHEW
CHEWABLE_TABLET | ORAL | Status: AC
Start: 2022-05-01 — End: ?
  Filled 2022-05-01: qty 4

## 2022-05-01 MED ORDER — ASPIRIN 81 MG PO CHEW
324.0000 mg | CHEWABLE_TABLET | Freq: Once | ORAL | Status: AC
Start: 2022-05-01 — End: 2022-05-01
  Administered 2022-05-01: 324 mg via ORAL

## 2022-05-01 NOTE — ED Provider Notes (Signed)
Patient Identification  Cindy Rosales is a 26 y.o. female.  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  She states that over the last week she has had these episodes with increasing frequency, several times a day, each episode lasting several hours.  She has not been able to identify any triggers.  She denies any exertional chest discomfort and often movement and walking improves her symptoms.  Her symptoms tend to be worse at night particular if she is lying down.  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  She has a history of anxiety but she states that she feels much of her anxiety is related to her health and being anxious about her symptoms, rather than anxiety being the primary cause of her symptoms.  She does not have a local PCP but has made an appointment as a new patient to establish care in a couple weeks.    PMH/SH/FH  The following portions of the patient's history were reviewed and updated as appropriate: She  has a past medical history of Autoimmune disorder, Disorder of thyroid, Elevated LFTs, IBS (irritable bowel syndrome), and PCOS (polycystic ovarian syndrome).  She does not have a problem list on file.  She  has no past surgical history on file.  Her family history includes Diabetes in her mother; Hypertension in her father.  She  reports that she has never smoked. She has never used smokeless tobacco. She reports that she does not currently use alcohol.  She reports that she does not use drugs.  She   No current facility-administered medications on file prior to encounter.     Current Outpatient Medications on File Prior to Encounter   Medication Sig Dispense Refill    hydroxychloroquine (PLAQUENIL) 200 MG tablet Take 1 tablet (200 mg) by mouth 2 (two) times daily      levothyroxine (SYNTHROID) 75 MCG tablet Take 1 tablet (75 mcg) by mouth Once a day at 6:00am      hydrOXYzine (VISTARIL) 25 MG capsule Take 1 capsule (25 mg) by mouth 4 (four) times daily as needed for Anxiety 40 capsule 0    VITAMIN D PO Take by mouth       She is allergic to clindamycin and doxycycline.  Review of Systems  Subjective   Review of Systems   Unless otherwise specified, all reviewed systems are negative.   Wt Readings from Last 5 Encounters:   05/01/22 95.3 kg   04/18/22 97.8 kg        Objective:   Vital signs reviewed. Nursing Notes reviewed.  Physical Exam  Vitals and nursing note reviewed.   Constitutional:       General: She is not in acute distress.     Appearance: Normal appearance. She is well-developed. She is not ill-appearing or toxic-appearing.   HENT:      Head: Normocephalic and atraumatic.      Right Ear: External ear normal.      Left Ear: External ear normal.      Nose: Nose  normal.      Mouth/Throat:      Mouth: Mucous membranes are moist.      Pharynx: Oropharynx is clear.   Eyes:      General: No scleral icterus.     Conjunctiva/sclera: Conjunctivae normal.   Neck:      Vascular: No JVD.   Cardiovascular:      Rate and Rhythm: Normal rate and regular rhythm.      Heart sounds: Normal heart sounds, S1 normal and S2 normal. No murmur heard.     No gallop (no mid-systolic click on auscultation).      Comments: [NOTE: The presence or absence of a heart murmur, if documented, was accomplished by manually deleting a "triple-asterixis hard stop" in my template. I do not use templates that have a default documentation related to heart sounds. There is no chance that  murmur-related documentation has been inadvertently "cloned."--RDL]      Pulmonary:      Effort: Pulmonary effort is normal.      Breath sounds: Normal breath sounds.   Musculoskeletal:      Cervical back: Neck supple.      Right lower leg: No edema.      Left lower leg: No edema.   Skin:     General: Skin is warm and dry.      Capillary Refill: Capillary refill takes less than 2 seconds.   Neurological:      General: No focal deficit present.      Mental Status: She is alert and oriented to person, place, and time.      Cranial Nerves: Cranial nerves 2-12 are intact.   Psychiatric:         Attention and Perception: Attention normal.         Mood and Affect: Affect normal.         Speech: Speech normal.         Behavior: Behavior normal. Behavior is cooperative.        Cardiographics  My interpretation takes priority over the computer interpretation (see below)   -- R. Mart Piggs, MD  Last EKG Result       Procedure Component Value Units Date/Time    ECG 12 lead [id number] Collected: 05/01/22 0253     Updated: 05/01/22 0344     Patient Age 63 years      Patient DOB 1996-07-10     Patient Height --     Patient Weight --     Interpretation Text --     Sinus rhythm  Compared to ECG 04/18/2022 09:18:24  No significant changes  Electronically Signed On 05-01-2022 3:43:00 EDT by R. Mart Piggs       Physician Interpreter R. Mart Piggs     Ventricular Rate 68 //min      QRS Duration 98 ms      P-R Interval 191 ms      Q-T Interval 399 ms      Q-T Interval(Corrected) 425 ms      P Wave Axis 19 deg      QRS Axis 1 deg      T Axis 13 years         EKG ordered, reviewed, and independently interpreted by me as follows:  Sinus rhythm  Compared to ECG 04/18/2022 09:18:24  No significant changes  --R. Mart Piggs, MD}    Laboratory  Laboratory studies were ordered to evaluate/rule-out: Infectious process, metabolic disarray, renal function, hepatic function, risk stratification for  myocardial ischemia, and/or risk stratification for  PE/DVT  Results       Procedure Component Value Units Date/Time    TSH, Abn Reflex to Free T4, Serum [id number] Collected: 05/01/22 0335    Specimen: Plasma Updated: 05/01/22 0432     TSH (Reflex Free T4 if Indicated) 3.02 uIU/mL     C Reactive Protein [id number] Collected: 05/01/22 0335    Specimen: Plasma Updated: 05/01/22 0432     C-Reactive Protein 0.11 mg/dL     Magnesium [id number] Collected: 05/01/22 0335    Specimen: Plasma Updated: 05/01/22 0428     Magnesium 1.9 mg/dL     Troponin I [id number] Collected: 05/01/22 0335    Specimen: Plasma Updated: 05/01/22 0415     Troponin I 0.00 ng/mL     Beta HCG, Qual, Serum [id number] Collected: 05/01/22 0335    Specimen: Plasma Updated: 05/01/22 0412     BHCG Qualitative Negative    Comprehensive metabolic panel [id number]  (Abnormal) Collected: 05/01/22 0335    Specimen: Plasma Updated: 05/01/22 0411     Sodium 139 mMol/L      Potassium 3.5 mMol/L      Chloride 107 mMol/L      CO2 20 mMol/L      Calcium 9.4 mg/dL      Glucose 91 mg/dL      Creatinine 0.71 mg/dL      BUN 7 mg/dL      Protein, Total 7.2 gm/dL      Albumin 3.9 gm/dL      Alkaline Phosphatase 36 U/L      ALT 14 U/L      AST (SGOT) 20 U/L      Bilirubin, Total 0.5 mg/dL      Albumin/Globulin Ratio 1.18 Ratio      Anion Gap 15.5 mMol/L      BUN / Creatinine Ratio 9.9 Ratio      EGFR 120 mL/min/1.70m      Osmolality Calculated 275 mOsm/kg      Globulin 3.3 gm/dL     Sedimentation rate (ESR) [[id number]: 05/01/22 0335    Specimen: Blood Updated: 05/01/22 0411     Sed Rate 15 mm/hr     PT/APTT [[id number] (Abnormal) Collected: 05/01/22 0335    Specimen: Blood Updated: 05/01/22 0410     PT 11.4 sec      PT INR 1.0     aPTT 27.3 sec     Lyme Ab Tot Rflx to WB IGG/IGM [[id number]: 05/01/22 0335    Specimen: Blood Updated: 05/01/22 0ArcoSpotted Fever IgG/IgM with Titer [[id number]: 05/01/22 0335    Specimen: Blood Updated: [id number]   Ehrlichia  chaffeensis IgG and IgM [[id number]: 05/01/22 0335    Specimen: Blood Updated: 05/01/22 0342    CBC and differential [[id number] (Abnormal) Collected: 05/01/22 0307    Specimen: Blood Updated: 05/01/22 0340     WBC 6.6 K/cmm      RBC 5.40 M/cmm      Hemoglobin 16.0 gm/dL      Hematocrit 47.5 %      MCV 88 fL      MCH 30 pg      MCHC 34 gm/dL      RDW 11.7 %      PLT CT 257 K/cmm      MPV 6.3 fL      Neutrophils % 51.2 %      Lymphocytes 34.7 %  Monocytes 11.7 %      Eosinophils % 1.2 %      Basophils % 1.2 %      Neutrophils Absolute 3.4 K/cmm      Lymphocytes Absolute 2.3 K/cmm      Monocytes Absolute 0.8 K/cmm      Eosinophils Absolute 0.1 K/cmm      Basophils Absolute 0.1 K/cmm           Imaging  Radiology studies were ordered to evaluate/rule-out: Acute cardiopulmonary disease  Radiology Results (24 Hour)       Procedure Component Value Units Date/Time    XR Chest AP Portable [id number] Collected: 05/01/22 0339    Order Status: Completed Updated: 05/01/22 0342    Narrative:      CLINICAL HISTORY: CHEST PAIN  Study Notes: Upper chest pain x 1 week w/ hypertension.     EXAMINATION:  XR CHEST AP PORTABLE    COMPARISON:  none.     FINDINGS:    Lungs: clear      Pleural space: Within normal limits.    Mediastinum: Within normal limits.    Bones: No acute abnormality.    Soft tissues: Within normal limits.        Impression:      No acute findings.    ReadingStation:[id number]          Radiology studies were ordered, reviewed, and independently interpreted by me as follows:  My preliminary read of the chest x-ray reveals no acute disease    Patient Vitals for the past 24 hrs:   BP Temp Temp src Pulse Resp SpO2 Height Weight   05/01/22 0456 (!) 127/97 -- -- 65 22 97 % -- --   05/01/22 0431 127/87 -- -- 71 16 97 % -- --   05/01/22 0422 117/89 -- -- 61 18 96 % -- --   05/01/22 0337 128/87 -- -- (!) 57 (!) 11 96 % -- --   05/01/22 0253 (!) 123/94 98.4 F (36.9 C) Oral 88 20 98 % 1.651 m 95.3 kg           Medications   aspirin chewable tablet 324 mg (324 mg Oral Given 05/01/22 0341)   propranolol (INDERAL) tablet 10 mg (10 mg Oral Given 05/01/22 0456)     ER procedures (if applicable)  Procedures  MEDICAL DECISION MAKING  History obtained from:   '[x]'$ Patient  '[]'$ Independent historian(s):    History/Exam limitations requiring use of independent historian: '[]'$ Patient's age  '[]'$ Nonverbal  '[]'$ Cognitive impairment   '[]'$ Psychiatric disturbance  '[]'$ Altered mental status/confusion/delirium  '[]'$ Respiratory distress  '[]'$ Acuity of condition/critical condition  '[]'$ Poor historian  '[]'$ Not cooperative     '[]'$ In spite of limitations, independent historian not available   External Data Review '[x]'$ Medical Records: 02/25/22 rheumatology office visit from [facility] in order to evaluate patient's baseline autoimmune status in order to establish appropriate differential diagnoses based on her current ER presentation  '[x]'$ Previous lab results: 03/01/2021 rheumatology labs from [facility] in order to evaluate patient's baseline autoimmune and medical status in order to establish appropriate differential diagnoses based on her current ER presentation  '[]'$ Previous radiology results:   '[]'$ Previous EKG results:    '[x]'$ Diagnosis and treatment significantly limited by social determinants of health  '[]'$ --Education   '[]'$ --Literacy   '[]'$ --Employment   '[]'$ --Financial barriers to healthcare  '[]'$ --Housing instability  '[]'$ --Food insecurity   '[x]'$ --Social Environment: [place] due to seasonal work at the [employer] with no local primary care provider currently  '[]'$ --Life  Events  '[]'$ --Family and Social Support Issues   '[]'$ --Psychosocial Circumstances  '[]'$ --Experiences with Crime, Violence, and the Judicial Systems  '[]'$ --Non-English speaking   Studies ordered '[x]'$ --Laboratory   (details documented above)    '[x]'$ --Radiology   (details documented above)    '[x]'$ --Electrocardiogram   (details documented above)   RISKS    Drugs         '[]'$ OTC drugs  '[x]'$ Prescription drug  management  '[]'$ Parenteral controlled substances  '[]'$ Drug therapy requiring intensive  monitoring for toxicity   Treatment '[x]'$ Decision regarding hospitalization  '[]'$ Decision not to resuscitate or to de-escalate care because of poor prognosis   Surgery '[]'$ Emergency major surgery  '[]'$ Minor surgery with NO identified risk factors  '[]'$ Minor surgery with identified risk factors  '[]'$ Elective major surgery with NO identified risk factors  '[]'$ Elective major surgery with identified risk factors     Discussion of management or test interpretation with external provider(s):     Medical Decision Making Narrative:  Differential diagnoses includes but are not limited to paroxysmal dysrhythmias, mitral valve prolapse, tickborne illness, symptoms of rheumatic disease, symptomatic hypothyroidism, obstructive sleep apnea, viral syndrome, conversion/functional, anginal equivalent, POTS, pheochromocytoma, hypertensive urgency.  Each of these diagnoses have been carefully considered.  An immediate life-threatening or otherwise imminently serious etiology seems very unlikely.  A Holter monitor, echocardiogram, or sleep study may be reasonable tests for her to have on an outpatient basis.  Such outpatient studies should be ordered by a primary care provider who will be able to monitor for results.  Ordering these studies from the ER is not advisable because there is a different ER doctor each day and results, particularly negative results, will sometimes not receive proper follow-up or communication with the patient.  Regardless, I think is reasonable to order a panel for tickborne illnesses.  These results should be back within the week.  The patient will look for these results on MyChart.  Her rheumatologist will be able to see these results on [place] and will be able to follow these results with her.  In the meantime, I prescribed propanolol to take as needed.  This is appropriate symptomatic treatment for many of the  possibilities I listed above in her differential diagnoses.  Side effect profile reviewed.  She will follow-up with her PCP or return to the emergency department if needed.      Assessment:     Heart Score Calculator    0 points 1 point 2 points    History Slightly suspicious Moderately suspicious Highly suspicious 0   EKG Normal Non-specific repolarization disturbance Significant ST depression 0   Age (years) <45 45-64 ?65 0   Risk factors* No known risk factors 1-2 risk factors ?3 risk factors or history of atherosclerotic disease +1   Initial troponin ?normal limit 1-3 normal limit >3 normal limit 0      Total Score   1     *Risk Factors    '[]'$ Hypertension '[]'$ Parent/sibling with CAD before age 32   '[]'$ Hypercholesterolemia '[]'$ Prior myocardial infarction   '[]'$ Diabetes mellitus '[]'$ History of CABG/PCI   '[x]'$ BMI greater than 30 kg/m '[]'$ History of CVA/TIA   '[]'$ Smoking (or quit < 3 months ago) '[]'$ Peripheral arterial disease     Score 0 - 3:  Major Adverse Cardiac Event risk 2.5% over next six weeks  Score 4 - 6: Major Adverse Cardiac Event risk 20.3% over next six weeks  Score 7 - 10: Major Adverse Cardiac Event risk 72.7% over next six weeks  Major Adverse Cardiac Event  is defined as all-cause mortality, myocardial infarction, or coronary revascularization.         Wells Criteria For Pulmonary Embolism:  Clinical Signs and Symptoms of DVT?  No (0)   PE Is #1 Diagnosis, or Equally Likely?: No (0)   Heart Rate > 100?:  No (0)   Immobilization at least 3 days, or Surgery in the Previous 4 weeks?:  No (0)    Previous, objectively diagnosed PE or DVT?: No (0)   Hemoptysis?: No (0)   Malignancy w/ Treatment within 6 mo, or palliative?:  No (0)    Score:  0       0 - 2........Marland KitchenLow Risk       2 - 6........Marland KitchenModerate Risk       >6   ........Marland KitchenHigh Risk         PERC Rule for Pulmonary Embolism             If no criteria are positive and clinician's pre-test probability is <15%, PERC Rule criteria are satisfied.                    If any  criteria are positive, the PERC rule cannot be used to rule out PE in this patient.   Age ? 50?:  '[]'$   Trauma or Surgery within 4 weeks?: '[]'$      HR ? 100?:  '[]'$   Hemoptysis?:  '[]'$      O2 Sat on Room Air < 95%?:  '[]'$   Exogenous Estrogen?:  '[]'$      Prior History of Venous Thromboembolism?: '[]'$   Unilateral Leg Swelling?:  '[]'$           Final diagnoses:   Palpitations   Elevated blood pressure reading   Globus sensation     Patient (and/or care partner, if applicable) instructed to return to the emergency room for re-evaluation if symptoms worsen or persist.   '[x]'$ Alarm signs/symptoms that should prompt immediate return to the ER are explained.   '[x]'$ All questions answered.   '[x]'$ The patient (and/or care partner, if applicable) agrees with the game plan moving forward.  Plan:     ED Disposition       ED Disposition   Discharge    Condition   --    Date/Time   Sun May 01, 2022  4:46 AM    Comment   Natasha Mead discharge to home/self care.    Condition at disposition: Stable               Patient's evaluation indicates the patient does not appear to have a need for inpatient workup and/or management. No further testing indicated acutely. No further emergency department management is indicated at this time.  '[x]'$ Pertinent data and results were reviewed by me and discussed with the patient and care partner(s), if present.   '[]'$ I personally reviewed pertinent radiology images on the computer monitor with the patient and care partner(s), if present  '[x]'$ I had a detailed discussion about the diagnosis, workup and treatment plan, and we are in agreement.         New Prescriptions    PROPRANOLOL (INDERAL) 10 MG TABLET    Take 1 tablet (10 mg) by mouth 3 (three) times daily as needed (palpitations, chest discomfort, flushed feeling, anxiety)     '[x]'$ Risk & Benefits of the new medication(s) were explained to the patient (and family) who verbalized understanding & agreed to the treatment plan. Patient (family) encouraged to contact me/clinical  staff with any questions/concerns  Although significant efforts were made to ensure accuracy of spelling and grammar, today's note was completed in large part by speech/voice recognition [employer]) software and by keyboarding information into the electronic health care record in real time.  As such, there may be errors in grammar and spelling, insertion of incorrect words/phrases, pronoun errors, etc., that should be disregarded when reviewing this note.                   York Spaniel, MD  05/01/22 [phone]

## 2022-05-03 LAB — LYME AB, TOTAL,REFLEX TO WESTERN BLOT (IGG & IGM): Lyme AB: NONREACTIVE

## 2022-05-04 LAB — ROCKY MOUNTAIN SPOTTED FEVER IGG/IGM WITH TITER
Rocky Mountain spotted fever (RMSF), IgG: NOT DETECTED
Rocky Mountain spotted fever (RMSF), IgM: NOT DETECTED

## 2022-05-05 LAB — EHRLICHIA CHAFFEENSIS ANTIBODIES (IGG,IGM)
Ehrlichia chaffeensis IgM: <1:20 {titer}
Ehrlichia chaffeensis, IgG: <1:64 {titer}

## 2022-05-23 ENCOUNTER — Encounter: Payer: Self-pay | Admitting: Family

## 2022-05-23 ENCOUNTER — Other Ambulatory Visit: Payer: Self-pay | Admitting: Physician Assistant

## 2022-05-23 DIAGNOSIS — R768 Other specified abnormal immunological findings in serum: Secondary | ICD-10-CM

## 2022-05-23 DIAGNOSIS — R0789 Other chest pain: Secondary | ICD-10-CM

## 2022-05-23 NOTE — Telephone Encounter (Signed)
Next Visit: 08/26/2022  Last Visit: 02/25/2022  Labs: 05/01/2022 CMP WNL except Alkaline Phosphatase 36, BUN 9.9 CBC WNL RBC 5.4  Eye exam:  02/25/2022 WNL    Current Dose per office note 02/25/2022:  Plaquenil 200 mg 1 tablet by mouth twice daily  YQ:MGNOIBBCWU disease   Last Fill: 02/25/2022  Okay to refill Plaquenil?

## 2022-06-23 ENCOUNTER — Ambulatory Visit
Admission: RE | Admit: 2022-06-23 | Discharge: 2022-06-23 | Disposition: A | Discharge status: Home / Self Care | Payer: BLUE CROSS/BLUE SHIELD | Source: Ambulatory Visit | Attending: Family | Admitting: Family

## 2022-06-23 DIAGNOSIS — R0789 Other chest pain: Secondary | ICD-10-CM | POA: Insufficient documentation

## 2022-07-04 DIAGNOSIS — Z79899 Other long term (current) drug therapy: Secondary | ICD-10-CM | POA: Diagnosis not present

## 2022-07-05 DIAGNOSIS — G43909 Migraine, unspecified, not intractable, without status migrainosus: Secondary | ICD-10-CM | POA: Diagnosis not present

## 2022-07-05 DIAGNOSIS — Z803 Family history of malignant neoplasm of breast: Secondary | ICD-10-CM | POA: Diagnosis not present

## 2022-07-05 DIAGNOSIS — E669 Obesity, unspecified: Secondary | ICD-10-CM | POA: Diagnosis not present

## 2022-07-05 DIAGNOSIS — Z01419 Encounter for gynecological examination (general) (routine) without abnormal findings: Secondary | ICD-10-CM | POA: Diagnosis not present

## 2022-08-01 DIAGNOSIS — Z79899 Other long term (current) drug therapy: Secondary | ICD-10-CM | POA: Diagnosis not present

## 2022-08-12 ENCOUNTER — Telehealth: Payer: Self-pay | Admitting: *Deleted

## 2022-08-12 NOTE — Progress Notes (Signed)
Office Visit Note  Patient: Ariana Scott             Date of Birth: [birth year]           MRN: [id number]             PCP: Pcp, No Referring: No ref. provider found Visit Date: 08/26/2022 Occupation: _0 @  Subjective:  Lower back pain  History of Present Illness: Ariana Scott is a 26 y.o. female with history of autoimmune disease.  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  He had a bite on her thigh in June 2023.  She states the bite gradually got worse.  She was started on clindamycin but had a reaction to it.  She switched to doxycycline and could not tolerate it.  Then eventually she was a started on cefuroxime.  She has residual nerve pain in that area but bite has completely resolved.  She had photosensitivity while she was in [place] and use sunscreen on a regular basis.  She continues to have dry mouth and dry eye symptoms.  She denies any history of oral ulcers or nasal ulcers.  There is no history of Raynaud's phenomenon, lymphadenopathy or inflammatory arthritis.  She has been experiencing lower back pain for the last 2 months without any radiculopathy.  She states the lower back pain is mostly stiffness in her muscles.  She has some SI joint discomfort around her menstrual cycle.  Activities of Daily Living:  Patient reports morning stiffness for 30 minutes.   Patient Denies nocturnal pain.  Difficulty dressing/grooming: Denies Difficulty climbing stairs: Denies Difficulty getting out of chair: Denies Difficulty using hands for taps, buttons, cutlery, and/or writing: Denies  Review of Systems  Constitutional:  Negative for fatigue.  HENT:  Positive for mouth dryness.   Eyes:  Negative for dryness.  Respiratory:  Negative for shortness of breath.   Cardiovascular:  Negative for chest pain and palpitations.  Gastrointestinal:  Negative for blood in stool, constipation and diarrhea.  Endocrine: Negative for increased  urination.  Genitourinary:  Negative for involuntary urination.  Musculoskeletal:  Positive for morning stiffness. Negative for joint pain, gait problem, joint pain, joint swelling, myalgias, muscle weakness, muscle tenderness and myalgias.  Skin:  Positive for rash and sensitivity to sunlight. Negative for color change and hair loss.  Allergic/Immunologic: Negative for susceptible to infections.  Neurological:  Positive for headaches. Negative for dizziness.  Hematological:  Negative for swollen glands.  Psychiatric/Behavioral:  Positive for depressed mood. Negative for sleep disturbance. The patient is nervous/anxious.     PMFS History:  Patient Active Problem List   Diagnosis Date Noted   Abnormal MRI 11/18/2021   Vision changes 11/18/2021   Chronic migraine w/o aura w/o status migrainosus, not intractable 11/18/2021   Hypothyroidism due to Hashimoto's thyroiditis 03/24/2020   Hair loss 08/13/2019   Chronic midline low back pain without sciatica 07/08/2019   Elevated LFTs 07/08/2019   Fatty liver 07/08/2019   Autoimmune disease ([place]) 07/08/2019   Anxiety 79/39/0300   Lichen planus 92/33/0076   Other acne 05/01/2019   Hypothyroidism (acquired) 01/24/2019   Dry skin 22/63/3354   Erosive lichen planus of vulva 09/13/2015   Diarrhea 09/12/2014   Cervical sprain 09/11/2013    Past Medical History:  Diagnosis Date   Autoimmune disease ([place])    Eczema    Erosive lichen planus of vulva    Hashimoto's disease    Hypothyroidism  IBS (irritable bowel syndrome)    Migraines    Polycystic ovarian syndrome     Family History  Problem Relation Age of Onset   Diabetes Mother    Depression Mother    ADD / ADHD Mother    Depression Father    Vitamin D deficiency Father    Colitis Father    Hypertension Father    High Cholesterol Father    ADD / ADHD Brother    Past Surgical History:  Procedure Laterality Date   COLONOSCOPY  2020   Dr. Collene Mares   eyelid surgery Right  03/26/2021   upper eyelid surgery   UPPER GASTROINTESTINAL ENDOSCOPY  2020   Dr. Collene Mares   WISDOM TOOTH EXTRACTION  2016   Social History   Social History Narrative   Not on file   Immunization History  Administered Date(s) Administered   COVID-19, mRNA, vaccine(Comirnaty)12 years and older 08/17/2022   DTaP 10/16/1995, 11/29/1995, 01/11/1996, 01/10/1997, 04/28/2000   HIB (PRP-OMP) 10/16/1995, 11/29/1995, 01/11/1996, 01/10/1997   HPV Quadrivalent 05/21/2007, 07/03/2008   Hepatitis A 05/21/2007, 07/03/2008   Hepatitis B 12-24-1994, 08/01/1995, 01/11/1996   IPV 10/16/1995, 11/29/1995, 01/11/1996, 04/28/2000   MMR 07/02/1996, 04/28/2000   Meningococcal Conjugate 05/21/2007   PFIZER(Purple Top)SARS-COV-2 Vaccination 12/07/2019, 12/31/2019, 08/18/2020   Tdap 04/23/2007     Objective: Vital Signs: BP (!) 122/90 (BP Location: Left Arm, Patient Position: Sitting, Cuff Size: Large)   Pulse 65   Resp 16   Ht _0  (1.651 m)   Wt 214 lb (97.1 kg)   LMP 08/25/2022 (Exact Date)   BMI 35.61 kg/m    Physical Exam Vitals and nursing note reviewed.  Constitutional:      Appearance: She is well-developed.  HENT:     Head: Normocephalic and atraumatic.  Eyes:     Conjunctiva/sclera: Conjunctivae normal.  Cardiovascular:     Rate and Rhythm: Normal rate and regular rhythm.     Heart sounds: Normal heart sounds.  Pulmonary:     Effort: Pulmonary effort is normal.     Breath sounds: Normal breath sounds.  Abdominal:     General: Bowel sounds are normal.     Palpations: Abdomen is soft.  Musculoskeletal:     Cervical back: Normal range of motion.  Lymphadenopathy:     Cervical: No cervical adenopathy.  Skin:    General: Skin is warm and dry.     Capillary Refill: Capillary refill takes less than 2 seconds.     Comments: Facial erythema was noted over forehead cheeks and chin.  Neurological:     Mental Status: She is alert and oriented to person, place, and time.  Psychiatric:         Behavior: Behavior normal.      Musculoskeletal Exam: Cervical spine was in good range of motion.  Shoulder joints, elbow joints, wrist joints, MCPs PIPs and DIPs and good range of motion with no synovitis.  Hip joints and knee joints with good range of motion.  She had no tenderness over ankles or MTPs.  CDAI Exam: CDAI Score: -- Patient Global: --; Provider Global: -- Swollen: --; Tender: -- Joint Exam 08/26/2022   No joint exam has been documented for this visit   There is currently no information documented on the homunculus. Go to the Rheumatology activity and complete the homunculus joint exam.  Investigation: No additional findings.  Imaging: No results found.  Recent Labs: Lab Results  Component Value Date   WBC 5.5 08/16/2022   HGB  15.9 (H) 08/16/2022   PLT 271 08/16/2022   NA 139 08/16/2022   K 4.1 08/16/2022   CL 105 08/16/2022   CO2 24 08/16/2022   GLUCOSE 78 08/16/2022   BUN 8 08/16/2022   CREATININE 0.76 08/16/2022   BILITOT 0.4 08/16/2022   AST 24 08/16/2022   ALT 19 08/16/2022   PROT 7.8 08/16/2022   CALCIUM 9.8 08/16/2022   GFRAA 133 03/01/2021    Speciality Comments: PLQ eye exam: 08/01/2022 WNL [facility] Follow up in 1 year.                              Procedures:  No procedures performed Allergies: Kiwi extract and Pineapple   Assessment / Plan:     Visit Diagnoses: Autoimmune disease ([place]) - Positive ANA, double-stranded DNA, positive Ro, history of photosensitivity, facial rash and elevated LFTs: -She has noticed improvement on hydroxychloroquine.  She continues to have positive DNA and positive ANA.  Complements and sed rate were normal recently.  Lab findings were discussed with the patient.  Will repeat labs in 3 months.  I advised patient to contact me in case she develops any new symptoms.  Plan: Protein / creatinine ratio, urine, Anti-DNA antibody, double-stranded, C3 and C4, Sedimentation rate  High risk medication use -  Plaquenil 200 mg 1 tablet by mouth twice daily started in September 2020. PLQ eye exam: 08/01/2022 -Labs obtained on August 16, 2022 showed normal CBC and CMP.  Hemoglobin was mildly elevated.  Plan: CBC with Differential/Platelet, COMPLETE METABOLIC PANEL WITH GFR.  Information on immunization was placed in the AVS.  Fatty liver - Right upper quadrant abdominal ultrasound performed on 04/02/2019.  LFTs within normal limits on 09/24/2021.  Chronic midline low back pain without sciatica-she has been experiencing lower back pain for the last couple of months.  She states it is more of her discomfort and stiffness.  She denies any radiculopathy.  A handout on back exercises was given.  Chronic SI joint pain-she experienced his SI joint pain around her menstrual cycle.  Eczema of both hands  Rash-she gives history of insect bite on her left thigh in June.  She developed worsening of the rash and possible secondary infection.  She was treated with clindamycin followed by doxycycline and then cefuroxime and finally rash cleared up.  Elevated blood pressure reading-blood pressure was 122/90 today.  Patient states her blood pressure usually runs normal.  I advised her to monitor blood pressure closely and follow-up with her PCP.  Other fatigue-she continues to have some fatigue.  Anxiety and depression-she is currently not taking any medications.  Lichen planus  Vitamin D deficiency-she takes vita D2 1000 units daily.  Vitamin D was 46 on April 20, 2021.  History of hypothyroidism  Hx of migraines - She has established care with Dr. Krista Blue and [facility] eye care.  She had a brain MRI on 11/16/21.  Orders: Orders Placed This Encounter  Procedures   Protein / creatinine ratio, urine   CBC with Differential/Platelet   COMPLETE METABOLIC PANEL WITH GFR   Anti-DNA antibody, double-stranded   C3 and C4   Sedimentation rate   No orders of the defined types were placed in this encounter.  Face-to-face  time spent with patient was 30 minutes.  Greater than 50% time was spent in counseling and coordination of care.  Follow-Up Instructions: Return in about 3 months (around 11/25/2022) for Autoimmune disease.  Bo Merino, MD  Note - This record has been created using Editor, commissioning.  Chart creation errors have been sought, but may not always  have been located. Such creation errors do not reflect on  the standard of medical care.

## 2022-08-12 NOTE — Telephone Encounter (Signed)
Patient called the office to inquire if she is due for labs. Patient advised she is due for labs. Patient had CBC and CMP in August. Patient advised her labs do not have to include those but she will need them again in February 2024. Patient states she will get them all done when she comes for labs so they will be due all at the same time. Reviewed lab hours with patient.

## 2022-08-16 ENCOUNTER — Other Ambulatory Visit: Payer: Self-pay

## 2022-08-16 DIAGNOSIS — Z79899 Other long term (current) drug therapy: Secondary | ICD-10-CM

## 2022-08-16 DIAGNOSIS — R7989 Other specified abnormal findings of blood chemistry: Secondary | ICD-10-CM

## 2022-08-16 DIAGNOSIS — M359 Systemic involvement of connective tissue, unspecified: Secondary | ICD-10-CM | POA: Diagnosis not present

## 2022-08-17 LAB — CBC WITH DIFFERENTIAL/PLATELET
Absolute Monocytes: 528 cells/uL (ref 200–950)
Basophils Absolute: 39 cells/uL (ref 0–200)
Basophils Relative: 0.7 %
Eosinophils Absolute: 39 cells/uL (ref 15–500)
Eosinophils Relative: 0.7 %
HCT: 46.5 % — ABNORMAL HIGH (ref 35.0–45.0)
Hemoglobin: 15.9 g/dL — ABNORMAL HIGH (ref 11.7–15.5)
Lymphs Abs: 1601 cells/uL (ref 850–3900)
MCH: 29.7 pg (ref 27.0–33.0)
MCHC: 34.2 g/dL (ref 32.0–36.0)
MCV: 86.8 fL (ref 80.0–100.0)
MPV: 9.5 fL (ref 7.5–12.5)
Monocytes Relative: 9.6 %
Neutro Abs: 3295 cells/uL (ref 1500–7800)
Neutrophils Relative %: 59.9 %
Platelets: 271 10*3/uL (ref 140–400)
RBC: 5.36 10*6/uL — ABNORMAL HIGH (ref 3.80–5.10)
RDW: 12.7 % (ref 11.0–15.0)
Total Lymphocyte: 29.1 %
WBC: 5.5 10*3/uL (ref 3.8–10.8)

## 2022-08-17 LAB — COMPLETE METABOLIC PANEL WITH GFR
AG Ratio: 1.4 (calc) (ref 1.0–2.5)
ALT: 19 U/L (ref 6–29)
AST: 24 U/L (ref 10–30)
Albumin: 4.5 g/dL (ref 3.6–5.1)
Alkaline phosphatase (APISO): 41 U/L (ref 31–125)
BUN: 8 mg/dL (ref 7–25)
CO2: 24 mmol/L (ref 20–32)
Calcium: 9.8 mg/dL (ref 8.6–10.2)
Chloride: 105 mmol/L (ref 98–110)
Creat: 0.76 mg/dL (ref 0.50–0.96)
Globulin: 3.3 g/dL (calc) (ref 1.9–3.7)
Glucose, Bld: 78 mg/dL (ref 65–99)
Potassium: 4.1 mmol/L (ref 3.5–5.3)
Sodium: 139 mmol/L (ref 135–146)
Total Bilirubin: 0.4 mg/dL (ref 0.2–1.2)
Total Protein: 7.8 g/dL (ref 6.1–8.1)
eGFR: 110 mL/min/{1.73_m2} (ref >=60)

## 2022-08-17 LAB — C3 AND C4
C3 Complement: 167 mg/dL (ref 83–193)
C4 Complement: 35 mg/dL (ref 15–57)

## 2022-08-17 LAB — URINALYSIS, ROUTINE W REFLEX MICROSCOPIC
Bilirubin Urine: NEGATIVE
Glucose, UA: NEGATIVE
Hgb urine dipstick: NEGATIVE
Ketones, ur: NEGATIVE
Leukocytes,Ua: NEGATIVE
Nitrite: NEGATIVE
Protein, ur: NEGATIVE
Specific Gravity, Urine: 1.002 (ref 1.001–1.035)
pH: 6.5 (ref 5.0–8.0)

## 2022-08-17 LAB — SEDIMENTATION RATE: Sed Rate: 17 mm/h (ref 0–20)

## 2022-08-17 LAB — ANTI-DNA ANTIBODY, DOUBLE-STRANDED: ds DNA Ab: 50 IU/mL — ABNORMAL HIGH

## 2022-08-17 NOTE — Progress Notes (Signed)
CBC stable.   CMP WNL. Complements and ESR WNL.  UA normal.

## 2022-08-18 NOTE — Progress Notes (Signed)
dsDNA is elevated-50.  Please clarify if she has been taking plaquenil as prescribed? Is she experiencing any signs or symptoms of a flare?  She is scheduled for an upcoming visit on 08/26/22.

## 2022-08-23 ENCOUNTER — Other Ambulatory Visit: Payer: Self-pay | Admitting: Physician Assistant

## 2022-08-23 DIAGNOSIS — R768 Other specified abnormal immunological findings in serum: Secondary | ICD-10-CM

## 2022-08-23 NOTE — Telephone Encounter (Signed)
Next Visit: 08/26/2022   Last Visit: 02/25/2022   Labs: 08/16/2022 CBC stable.   CMP WNL.  Eye exam:  08/01/2022 WNL   Current Dose per office note 02/25/2022:  Plaquenil 200 mg 1 tablet by mouth twice daily   MK:[id number] disease    Last Fill: 05/23/2022   Okay to refill Plaquenil?

## 2022-08-26 ENCOUNTER — Ambulatory Visit: Payer: Federal, State, Local not specified - PPO | Attending: Rheumatology | Admitting: Rheumatology

## 2022-08-26 ENCOUNTER — Encounter: Payer: Self-pay | Admitting: Rheumatology

## 2022-08-26 VITALS — BP 122/90 | HR 65 | Resp 16 | Ht 65.0 in | Wt 214.0 lb

## 2022-08-26 DIAGNOSIS — M533 Sacrococcygeal disorders, not elsewhere classified: Secondary | ICD-10-CM

## 2022-08-26 DIAGNOSIS — M545 Low back pain, unspecified: Secondary | ICD-10-CM | POA: Diagnosis not present

## 2022-08-26 DIAGNOSIS — M359 Systemic involvement of connective tissue, unspecified: Secondary | ICD-10-CM

## 2022-08-26 DIAGNOSIS — R030 Elevated blood-pressure reading, without diagnosis of hypertension: Secondary | ICD-10-CM

## 2022-08-26 DIAGNOSIS — E559 Vitamin D deficiency, unspecified: Secondary | ICD-10-CM

## 2022-08-26 DIAGNOSIS — R21 Rash and other nonspecific skin eruption: Secondary | ICD-10-CM

## 2022-08-26 DIAGNOSIS — F419 Anxiety disorder, unspecified: Secondary | ICD-10-CM

## 2022-08-26 DIAGNOSIS — L309 Dermatitis, unspecified: Secondary | ICD-10-CM

## 2022-08-26 DIAGNOSIS — K760 Fatty (change of) liver, not elsewhere classified: Secondary | ICD-10-CM | POA: Diagnosis not present

## 2022-08-26 DIAGNOSIS — G8929 Other chronic pain: Secondary | ICD-10-CM

## 2022-08-26 DIAGNOSIS — F32A Depression, unspecified: Secondary | ICD-10-CM

## 2022-08-26 DIAGNOSIS — Z8639 Personal history of other endocrine, nutritional and metabolic disease: Secondary | ICD-10-CM

## 2022-08-26 DIAGNOSIS — Z79899 Other long term (current) drug therapy: Secondary | ICD-10-CM | POA: Diagnosis not present

## 2022-08-26 DIAGNOSIS — R5383 Other fatigue: Secondary | ICD-10-CM

## 2022-08-26 DIAGNOSIS — L439 Lichen planus, unspecified: Secondary | ICD-10-CM

## 2022-08-26 DIAGNOSIS — R748 Abnormal levels of other serum enzymes: Secondary | ICD-10-CM

## 2022-08-26 DIAGNOSIS — Z8669 Personal history of other diseases of the nervous system and sense organs: Secondary | ICD-10-CM

## 2022-08-26 DIAGNOSIS — R7989 Other specified abnormal findings of blood chemistry: Secondary | ICD-10-CM

## 2022-08-26 NOTE — Patient Instructions (Signed)
Vaccines You are taking a medication(s) that can suppress your immune system.  The following immunizations are recommended: Flu annually Covid-19  Td/Tdap (tetanus, diphtheria, pertussis) every 10 years Pneumonia (Prevnar 15 then Pneumovax 23 at least 1 year apart.  Alternatively, can take Prevnar 20 without needing additional dose) Shingrix: 2 doses from 4 weeks to 6 months apart  Please check with your PCP to make sure you are up to date.  Back Exercises The following exercises strengthen the muscles that help to support the trunk (torso) and back. They also help to keep the lower back flexible. Doing these exercises can help to prevent or lessen existing low back pain. If you have back pain or discomfort, try doing these exercises 2-3 times each day or as told by your health care provider. As your pain improves, do them once each day, but increase the number of times that you repeat the steps for each exercise (do more repetitions). To prevent the recurrence of back pain, continue to do these exercises once each day or as told by your health care provider. Do exercises exactly as told by your health care provider and adjust them as directed. It is normal to feel mild stretching, pulling, tightness, or discomfort as you do these exercises, but you should stop right away if you feel sudden pain or your pain gets worse. Exercises Single knee to chest Repeat these steps 3-5 times for each leg: Lie on your back on a firm bed or the floor with your legs extended. Bring one knee to your chest. Your other leg should stay extended and in contact with the floor. Hold your knee in place by grabbing your knee or thigh with both hands and hold. Pull on your knee until you feel a gentle stretch in your lower back or buttocks. Hold the stretch for 10-30 seconds. Slowly release and straighten your leg.  Pelvic tilt Repeat these steps 5-10 times: Lie on your back on a firm bed or the floor with your  legs extended. Bend your knees so they are pointing toward the ceiling and your feet are flat on the floor. Tighten your lower abdominal muscles to press your lower back against the floor. This motion will tilt your pelvis so your tailbone points up toward the ceiling instead of pointing to your feet or the floor. With gentle tension and even breathing, hold this position for 5-10 seconds.  Cat-cow Repeat these steps until your lower back becomes more flexible: Get into a hands-and-knees position on a firm bed or the floor. Keep your hands under your shoulders, and keep your knees under your hips. You may place padding under your knees for comfort. Let your head hang down toward your chest. Contract your abdominal muscles and point your tailbone toward the floor so your lower back becomes rounded like the back of a cat. Hold this position for 5 seconds. Slowly lift your head, let your abdominal muscles relax, and point your tailbone up toward the ceiling so your back forms a sagging arch like the back of a cow. Hold this position for 5 seconds.  Press-ups Repeat these steps 5-10 times: Lie on your abdomen (face-down) on a firm bed or the floor. Place your palms near your head, about shoulder-width apart. Keeping your back as relaxed as possible and keeping your hips on the floor, slowly straighten your arms to raise the top half of your body and lift your shoulders. Do not use your back muscles to raise your upper   torso. You may adjust the placement of your hands to make yourself more comfortable. Hold this position for 5 seconds while you keep your back relaxed. Slowly return to lying flat on the floor.  Bridges Repeat these steps 10 times: Lie on your back on a firm bed or the floor. Bend your knees so they are pointing toward the ceiling and your feet are flat on the floor. Your arms should be flat at your sides, next to your body. Tighten your buttocks muscles and lift your buttocks off  the floor until your waist is at almost the same height as your knees. You should feel the muscles working in your buttocks and the back of your thighs. If you do not feel these muscles, slide your feet 1-2 inches (2.5-5 cm) farther away from your buttocks. Hold this position for 3-5 seconds. Slowly lower your hips to the starting position, and allow your buttocks muscles to relax completely. If this exercise is too easy, try doing it with your arms crossed over your chest. Abdominal crunches Repeat these steps 5-10 times: Lie on your back on a firm bed or the floor with your legs extended. Bend your knees so they are pointing toward the ceiling and your feet are flat on the floor. Cross your arms over your chest. Tip your chin slightly toward your chest without bending your neck. Tighten your abdominal muscles and slowly raise your torso high enough to lift your shoulder blades a tiny bit off the floor. Avoid raising your torso higher than that because it can put too much stress on your lower back and does not help to strengthen your abdominal muscles. Slowly return to your starting position.  Back lifts Repeat these steps 5-10 times: Lie on your abdomen (face-down) with your arms at your sides, and rest your forehead on the floor. Tighten the muscles in your legs and your buttocks. Slowly lift your chest off the floor while you keep your hips pressed to the floor. Keep the back of your head in line with the curve in your back. Your eyes should be looking at the floor. Hold this position for 3-5 seconds. Slowly return to your starting position.  Contact a health care provider if: Your back pain or discomfort gets much worse when you do an exercise. Your worsening back pain or discomfort does not lessen within 2 hours after you exercise. If you have any of these problems, stop doing these exercises right away. Do not do them again unless your health care provider says that you can. Get help  right away if: You develop sudden, severe back pain. If this happens, stop doing the exercises right away. Do not do them again unless your health care provider says that you can. This information is not intended to replace advice given to you by your health care provider. Make sure you discuss any questions you have with your health care provider. Document Revised: 02/23/2021 Document Reviewed: 11/11/2020 Elsevier Patient Education  2023 [employer]  

## 2022-10-21 ENCOUNTER — Ambulatory Visit: Payer: 59 | Admitting: Internal Medicine

## 2022-10-23 ENCOUNTER — Other Ambulatory Visit: Payer: Self-pay | Admitting: Internal Medicine

## 2022-11-14 ENCOUNTER — Other Ambulatory Visit: Payer: Self-pay | Admitting: *Deleted

## 2022-11-14 DIAGNOSIS — M359 Systemic involvement of connective tissue, unspecified: Secondary | ICD-10-CM

## 2022-11-14 DIAGNOSIS — Z79899 Other long term (current) drug therapy: Secondary | ICD-10-CM

## 2022-11-15 LAB — PROTEIN / CREATININE RATIO, URINE
Creatinine, Urine: 48 mg/dL (ref 20–275)
Protein/Creat Ratio: 83 mg/g creat (ref 24–184)
Protein/Creatinine Ratio: 0.083 mg/mg creat (ref 0.024–0.184)
Total Protein, Urine: 4 mg/dL — ABNORMAL LOW (ref 5–24)

## 2022-11-15 LAB — COMPLETE METABOLIC PANEL WITH GFR
AG Ratio: 1.6 (calc) (ref 1.0–2.5)
ALT: 13 U/L (ref 6–29)
AST: 20 U/L (ref 10–30)
Albumin: 4.8 g/dL (ref 3.6–5.1)
Alkaline phosphatase (APISO): 34 U/L (ref 31–125)
BUN: 10 mg/dL (ref 7–25)
CO2: 27 mmol/L (ref 20–32)
Calcium: 10.3 mg/dL — ABNORMAL HIGH (ref 8.6–10.2)
Chloride: 103 mmol/L (ref 98–110)
Creat: 0.82 mg/dL (ref 0.50–0.96)
Globulin: 3 g/dL (calc) (ref 1.9–3.7)
Glucose, Bld: 83 mg/dL (ref 65–99)
Potassium: 4.2 mmol/L (ref 3.5–5.3)
Sodium: 140 mmol/L (ref 135–146)
Total Bilirubin: 0.6 mg/dL (ref 0.2–1.2)
Total Protein: 7.8 g/dL (ref 6.1–8.1)
eGFR: 100 mL/min/{1.73_m2} (ref >=60)

## 2022-11-15 LAB — CBC WITH DIFFERENTIAL/PLATELET
Absolute Monocytes: 519 cells/uL (ref 200–950)
Basophils Absolute: 41 cells/uL (ref 0–200)
Basophils Relative: 0.7 %
Eosinophils Absolute: 41 cells/uL (ref 15–500)
Eosinophils Relative: 0.7 %
HCT: 45.9 % — ABNORMAL HIGH (ref 35.0–45.0)
Hemoglobin: 15.9 g/dL — ABNORMAL HIGH (ref 11.7–15.5)
Lymphs Abs: 1599 cells/uL (ref 850–3900)
MCH: 29.1 pg (ref 27.0–33.0)
MCHC: 34.6 g/dL (ref 32.0–36.0)
MCV: 84.1 fL (ref 80.0–100.0)
MPV: 10.4 fL (ref 7.5–12.5)
Monocytes Relative: 8.8 %
Neutro Abs: 3699 cells/uL (ref 1500–7800)
Neutrophils Relative %: 62.7 %
Platelets: 247 10*3/uL (ref 140–400)
RBC: 5.46 10*6/uL — ABNORMAL HIGH (ref 3.80–5.10)
RDW: 12.7 % (ref 11.0–15.0)
Total Lymphocyte: 27.1 %
WBC: 5.9 10*3/uL (ref 3.8–10.8)

## 2022-11-15 LAB — C3 AND C4
C3 Complement: 162 mg/dL (ref 83–193)
C4 Complement: 32 mg/dL (ref 15–57)

## 2022-11-15 LAB — ANTI-DNA ANTIBODY, DOUBLE-STRANDED: ds DNA Ab: 44 IU/mL — ABNORMAL HIGH

## 2022-11-15 LAB — SEDIMENTATION RATE: Sed Rate: 6 mm/h (ref 0–20)

## 2022-11-16 NOTE — Progress Notes (Signed)
Double-stranded DNA is positive and stable.  Calcium is slightly elevated.  Patient should avoid calcium supplement.  Hemoglobin is elevated and stable.  Urine protein creatinine ratio is normal sedimentation rate is normal and complements are normal.  Labs do not indicate an autoimmune disease flare.

## 2022-11-21 ENCOUNTER — Other Ambulatory Visit: Payer: Self-pay | Admitting: Physician Assistant

## 2022-11-21 DIAGNOSIS — R768 Other specified abnormal immunological findings in serum: Secondary | ICD-10-CM

## 2022-11-21 NOTE — Progress Notes (Unsigned)
Patient ID: Ariana Scott, female   DOB: February 13, 1996, 27 y.o.   MRN: [id number]   HPI  Ariana Scott is a 27 y.o.-year-old female, initially referred by Dr. Collene Mares, returning for follow-up for Hashimoto's hypothyroidism.  Last visit 1 year and 1 mo ago.  Interim hx: She still has memory problems.  Also, continues to have headaches and increased thirst, but no increased urination. No weight changes. She is very active at work.  Reviewed history: Pt. was diagnosed with hypothyroidism in 11/2018 during investigation for IBS by Dr. Collene Mares.    Patient has had constipation for years, but she then started to have loose stools (for 4 years) and was sent to GI.  Dr. Collene Mares checked her TSH which returned very high, at 42.35.   She was started on levothyroxine 75 mcg daily and referred to endocrinology.  In the past, she was taking 52-59 mcg levothyroxine daily, but was having palpitations so we switched to 50 mcg tablets, which are white.  She tolerates this well.  Pt is on levothyroxine 50 +25 mcg daily, taken: - in am - fasting - at least 30 min-1h from b'fast - no calcium - no iron - no multivitamins - no PPIs - not on Biotin On vitamin C and D.  I reviewed patient's TFTs: Lab Results  Component Value Date   TSH 2.41 10/15/2021   TSH 1.50 10/06/2020   TSH 2.35 03/24/2020   TSH 2.58 11/11/2019   TSH 4.19 09/16/2019   TSH 6.97 (H) 07/23/2019   TSH 5.72 (H) 06/18/2019   TSH 85.26 (H) 04/01/2019   TSH 1.38 01/24/2019   FREET4 0.82 10/15/2021   FREET4 0.90 10/06/2020   FREET4 0.90 03/24/2020   FREET4 0.91 11/11/2019   FREET4 0.91 09/16/2019   FREET4 0.82 07/23/2019   FREET4 0.82 06/18/2019   FREET4 0.46 (L) 04/01/2019   FREET4 0.97 01/24/2019  12/11/2018: TSH 42.35  Her antithyroid antibodies are high, pointing towards a diagnosis of Hashimoto's thyroiditis Component     Latest Ref Rng & Units 01/24/2019  Thyroperoxidase Ab SerPl-aCnc     <9 IU/mL 395 (H)  Thyroglobulin Ab     <  or = 1 IU/mL 2 (H)   She previously had palpitations, which improved after changing levothyroxine tablets to white ones (50 mcg).  Pt denies: - feeling nodules in neck - hoarseness - dysphagia - choking  She has + FH of thyroid disorders in: PGGF. No FH of thyroid cancer. No h/o radiation tx to head or neck. No herbal supplements. No Biotin use. No recent steroids use.   She also has a history of trochanteric bursitis. She had migraines with visual aura - on OCPs for years for her amenorrhea/irregular menses. She stopped OCPs afterwards. No more migraines.  Also, her menstrual cycles are now almost normal. She was also found to have elevated LFTs at investigation by Dr. Collene Mares.  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  She had a liver ultrasound >> fatty liver.   At last visit, we checked an HbA1c per her request due to increased thirst.  This was normal: Lab Results  Component Value Date   HGBA1C 5.1 10/15/2021   ROS: + see HPI  I reviewed pt's medications, allergies, PMH, social hx, family hx, and changes were documented in the history of present illness. Otherwise, unchanged from my initial visit note.  Past Medical History:  Diagnosis Date   Autoimmune disease ([place])  Eczema    Erosive lichen planus of vulva    Hashimoto's disease    Hypothyroidism    IBS (irritable bowel syndrome)    Migraines    Polycystic ovarian syndrome    Past Surgical History:  Procedure Laterality Date   COLONOSCOPY  2020   Dr. Collene Mares   eyelid surgery Right 03/26/2021   upper eyelid surgery   UPPER GASTROINTESTINAL ENDOSCOPY  2020   Dr. Collene Mares   WISDOM TOOTH EXTRACTION  2016   Social History   Socioeconomic History   Marital status: Single    Spouse name: Not on file   Number of children: 0   Years of education: Not on file   Highest education level: Not on file  Occupational History    Recent college graduate, unemployed  Social Transport planner strain: Not on file   Food insecurity:    Worry: Not on file    Inability: Not on file   Transportation needs:    Medical: Not on file    Non-medical: Not on file  Tobacco Use   Smoking status: Never Smoker   Smokeless tobacco: Never Used  Substance and Sexual Activity   Alcohol use:  1 glass of wine,   Drug use: No   Current Outpatient Medications on File Prior to Visit  Medication Sig Dispense Refill   Azelaic Acid 15 % cream Apply on the entire face daily after cleansing     cholecalciferol (VITAMIN D3) 25 MCG (1000 UNIT) tablet Take 2,000 Units by mouth every other day.     clobetasol ointment (TEMOVATE) 0.05 % daily as needed.     hydroxychloroquine (PLAQUENIL) 200 MG tablet TAKE 1 TABLET BY MOUTH TWICE A DAY 180 tablet 0   levothyroxine (SYNTHROID) 50 MCG tablet Take 1.5 tablets (75 mcg total) by mouth daily before breakfast. 135 tablet 1   SUMAtriptan (IMITREX) 50 MG tablet Take 1 tab at onset of migraine.  May repeat in 2 hrs, if needed.  Max dose: 2 tabs/day. This is a 30 day prescription. 9 tablet 11   No current facility-administered medications on file prior to visit.   Allergies  Allergen Reactions   Kiwi Extract Itching   Pineapple Itching   Family history: -Mother, aunt with diabetes -Father with HTN and HL -Cancer in paternal grandmother: Breast and uterus + See HPI  PE: There were no vitals taken for this visit. Wt Readings from Last 3 Encounters:  08/26/22 214 lb (97.1 kg)  02/25/22 216 lb 9.6 oz (98.2 kg)  11/18/21 210 lb 8 oz (95.5 kg)   Constitutional: overweight, in NAD Eyes:  EOMI, no exophthalmos ENT: no neck masses, no cervical lymphadenopathy Cardiovascular: RRR, No MRG Respiratory: CTA B Musculoskeletal: no deformities Skin:no rashes Neurological: no tremor with outstretched hands  ASSESSMENT: 1. Hypothyroidism -Due to Hashimoto's thyroiditis  PLAN:  1. Patient with h history of autoimmune hypothyroidism.  She  initially had symptoms consistent with hypothyroidism: Weight gain, dry skin, change in bowel habits, but also anxiety, tachycardia, tremors.  She takes anxiolytics as needed.  She felt better after starting levothyroxine, with less fatigue.  She initially had headaches, but this resolved after stopping OCPs.   - latest thyroid labs reviewed with pt. >> normal: Lab Results  Component Value Date   TSH 2.41 10/15/2021  - she continues on LT4 75 mcg daily - pt feels good on this dose. - we discussed about taking the thyroid hormone every day, with water, >30 minutes  before breakfast, separated by >4 hours from acid reflux medications, calcium, iron, multivitamins. Pt. is taking it correctly. - will check thyroid tests today: TSH and fT4 - If labs are abnormal, she will need to return for repeat TFTs in 1.5 months   Philemon Kingdom, MD PhD [facility] Endocrinology

## 2022-11-21 NOTE — Telephone Encounter (Signed)
Next Visit: 12/01/2022  Last Visit: 08/26/2022  Labs: 11/14/2022  Eye exam: 08/01/2022   Current Dose per office note 08/26/2022: Plaquenil 200 mg 1 tablet by mouth twice daily   [id number] disease   Last Fill: 08/23/2022  Okay to refill Plaquenil?

## 2022-11-22 ENCOUNTER — Encounter: Payer: Self-pay | Admitting: Internal Medicine

## 2022-11-22 ENCOUNTER — Ambulatory Visit (INDEPENDENT_AMBULATORY_CARE_PROVIDER_SITE_OTHER): Payer: Medicaid Other | Admitting: Internal Medicine

## 2022-11-22 VITALS — BP 128/68 | HR 87 | Ht 65.0 in | Wt 218.0 lb

## 2022-11-22 DIAGNOSIS — E063 Autoimmune thyroiditis: Secondary | ICD-10-CM | POA: Diagnosis not present

## 2022-11-22 DIAGNOSIS — E038 Other specified hypothyroidism: Secondary | ICD-10-CM | POA: Diagnosis not present

## 2022-11-22 LAB — TSH: TSH: 1.22 u[IU]/mL (ref 0.35–5.50)

## 2022-11-22 LAB — T4, FREE: Free T4: 0.95 ng/dL (ref 0.60–1.60)

## 2022-11-22 NOTE — Patient Instructions (Signed)
Please continue Levothyroxine 75 mcg every day.  Take the thyroid hormone every day, with water, at least 30 minutes before breakfast, separated by at least 4 hours from: - acid reflux medications - calcium - iron - multivitamins  Please stop at the lab.  Please come back for a follow-up appointment in 1 year. 

## 2022-11-23 NOTE — Progress Notes (Unsigned)
Office Visit Note  Patient: Ariana Scott             Date of Birth: 01-09-1996           MRN: [id number]             PCP: Pcp, No Referring: No ref. provider found Visit Date: 12/01/2022 Occupation: @GUAROCC @  Subjective:  Discuss lab results   History of Present Illness: Ariana Scott is a 27 y.o. female with history of autoimmune disease.  Patient is taking plaquenil 200 mg 1 tablet by mouth twice daily.    Lab work from 11/14/22 were reviewed today in the office: complements WNL, dsDNA 44-stable, ESR WNL.    CBC and CMP updated on 11/14/22.  PLQ eye exam: 08/01/2022 WNL [facility] Follow up in 1 year.    Activities of Daily Living:  Patient reports morning stiffness for *** {minute/hour:19697}.   Patient {ACTIONS;DENIES/REPORTS:21021675::"Denies"} nocturnal pain.  Difficulty dressing/grooming: {ACTIONS;DENIES/REPORTS:21021675::"Denies"} Difficulty climbing stairs: {ACTIONS;DENIES/REPORTS:21021675::"Denies"} Difficulty getting out of chair: {ACTIONS;DENIES/REPORTS:21021675::"Denies"} Difficulty using hands for taps, buttons, cutlery, and/or writing: {ACTIONS;DENIES/REPORTS:21021675::"Denies"}  No Rheumatology ROS completed.   PMFS History:  Patient Active Problem List   Diagnosis Date Noted   Abnormal MRI 11/18/2021   Vision changes 11/18/2021   Chronic migraine w/o aura w/o status migrainosus, not intractable 11/18/2021   Hypothyroidism due to Hashimoto's thyroiditis 03/24/2020   Hair loss 08/13/2019   Chronic midline low back pain without sciatica 07/08/2019   Elevated LFTs 07/08/2019   Fatty liver 07/08/2019   Autoimmune disease ([place]) 07/08/2019   Anxiety [id number]   Lichen planus [id number]   Other acne 05/01/2019   Hypothyroidism (acquired) 01/24/2019   Dry skin [id number]   Erosive lichen planus of vulva 09/13/2015   Diarrhea 09/12/2014   Cervical sprain 09/11/2013    Past Medical History:  Diagnosis Date   Autoimmune disease ([place])    Eczema     Erosive lichen planus of vulva    Hashimoto's disease    Hypothyroidism    IBS (irritable bowel syndrome)    Migraines    Polycystic ovarian syndrome     Family History  Problem Relation Age of Onset   Diabetes Mother    Depression Mother    ADD / ADHD Mother    Depression Father    Vitamin D deficiency Father    Colitis Father    Hypertension Father    High Cholesterol Father    ADD / ADHD Brother    Past Surgical History:  Procedure Laterality Date   COLONOSCOPY  2020   Dr. Collene Mares   eyelid surgery Right 03/26/2021   upper eyelid surgery   UPPER GASTROINTESTINAL ENDOSCOPY  2020   Dr. Collene Mares   WISDOM TOOTH EXTRACTION  2016   Social History   Social History Narrative   Not on file   Immunization History  Administered Date(s) Administered   COVID-19, mRNA, vaccine(Comirnaty)12 years and older 08/17/2022   DTaP 10/16/1995, 11/29/1995, 01/11/1996, 01/10/1997, 04/28/2000   HIB (PRP-OMP) 10/16/1995, 11/29/1995, 01/11/1996, 01/10/1997   HPV Quadrivalent 05/21/2007, 07/03/2008   Hepatitis A 05/21/2007, 07/03/2008   Hepatitis B 1994[birth year]12[birth year]04, 08/01/1995, 01/11/1996   IPV 10/16/1995, 11/29/1995, 01/11/1996, 04/28/2000   MMR 07/02/1996, 04/28/2000   Meningococcal Conjugate 05/21/2007   PFIZER(Purple Top)SARS-COV-2 Vaccination 12/07/2019, 12/31/2019, 08/18/2020   Pfizer Covid-19 Vaccine Bivalent Booster 39yrs & up 06/08/2021   Tdap 04/23/2007     Objective: Vital Signs: There were no vitals taken for this visit.   Physical Exam Vitals and nursing  note reviewed.  Constitutional:      Appearance: She is well-developed.  HENT:     Head: Normocephalic and atraumatic.  Eyes:     Conjunctiva/sclera: Conjunctivae normal.  Cardiovascular:     Rate and Rhythm: Normal rate and regular rhythm.     Heart sounds: Normal heart sounds.  Pulmonary:     Effort: Pulmonary effort is normal.     Breath sounds: Normal breath sounds.  Abdominal:     General: Bowel sounds are normal.      Palpations: Abdomen is soft.  Musculoskeletal:     Cervical back: Normal range of motion.  Lymphadenopathy:     Cervical: No cervical adenopathy.  Skin:    General: Skin is warm and dry.     Capillary Refill: Capillary refill takes less than 2 seconds.  Neurological:     Mental Status: She is alert and oriented to person, place, and time.  Psychiatric:        Behavior: Behavior normal.      Musculoskeletal Exam: ***  CDAI Exam: CDAI Score: -- Patient Global: --; Provider Global: -- Swollen: --; Tender: -- Joint Exam 12/01/2022   No joint exam has been documented for this visit   There is currently no information documented on the homunculus. Go to the Rheumatology activity and complete the homunculus joint exam.  Investigation: No additional findings.  Imaging: No results found.  Recent Labs: Lab Results  Component Value Date   WBC 5.9 11/14/2022   HGB 15.9 (H) 11/14/2022   PLT 247 11/14/2022   NA 140 11/14/2022   K 4.2 11/14/2022   CL 103 11/14/2022   CO2 27 11/14/2022   GLUCOSE 83 11/14/2022   BUN 10 11/14/2022   CREATININE 0.82 11/14/2022   BILITOT 0.6 11/14/2022   AST 20 11/14/2022   ALT 13 11/14/2022   PROT 7.8 11/14/2022   CALCIUM 10.3 (H) 11/14/2022   GFRAA 133 03/01/2021    Speciality Comments: PLQ eye exam: 08/01/2022 WNL [facility] Follow up in 1 year.                              Procedures:  No procedures performed Allergies: Kiwi extract and Pineapple   Assessment / Plan:     Visit Diagnoses: Autoimmune disease ([place])  High risk medication use  Fatty liver  Chronic SI joint pain  Eczema of both hands  Other fatigue  Anxiety and depression  Lichen planus  Vitamin D deficiency  History of hypothyroidism  Hx of migraines  Orders: No orders of the defined types were placed in this encounter.  No orders of the defined types were placed in this encounter.   Face-to-face time spent with patient was *** minutes. Greater  than 50% of time was spent in counseling and coordination of care.  Follow-Up Instructions: No follow-ups on file.   Ofilia Neas, PA-C  Note - This record has been created using Dragon software.  Chart creation errors have been sought, but may not always  have been located. Such creation errors do not reflect on  the standard of medical care.

## 2022-12-01 ENCOUNTER — Ambulatory Visit: Payer: Medicaid Other | Attending: Physician Assistant | Admitting: Physician Assistant

## 2022-12-01 ENCOUNTER — Encounter: Payer: Self-pay | Admitting: Physician Assistant

## 2022-12-01 VITALS — BP 124/85 | HR 77 | Resp 16 | Ht 66.0 in | Wt 224.0 lb

## 2022-12-01 DIAGNOSIS — F419 Anxiety disorder, unspecified: Secondary | ICD-10-CM

## 2022-12-01 DIAGNOSIS — L439 Lichen planus, unspecified: Secondary | ICD-10-CM

## 2022-12-01 DIAGNOSIS — M359 Systemic involvement of connective tissue, unspecified: Secondary | ICD-10-CM | POA: Diagnosis not present

## 2022-12-01 DIAGNOSIS — Z8669 Personal history of other diseases of the nervous system and sense organs: Secondary | ICD-10-CM

## 2022-12-01 DIAGNOSIS — Z79899 Other long term (current) drug therapy: Secondary | ICD-10-CM

## 2022-12-01 DIAGNOSIS — K760 Fatty (change of) liver, not elsewhere classified: Secondary | ICD-10-CM

## 2022-12-01 DIAGNOSIS — Z8639 Personal history of other endocrine, nutritional and metabolic disease: Secondary | ICD-10-CM

## 2022-12-01 DIAGNOSIS — M533 Sacrococcygeal disorders, not elsewhere classified: Secondary | ICD-10-CM

## 2022-12-01 DIAGNOSIS — R5383 Other fatigue: Secondary | ICD-10-CM

## 2022-12-01 DIAGNOSIS — F32A Depression, unspecified: Secondary | ICD-10-CM

## 2022-12-01 DIAGNOSIS — E559 Vitamin D deficiency, unspecified: Secondary | ICD-10-CM

## 2022-12-01 DIAGNOSIS — G8929 Other chronic pain: Secondary | ICD-10-CM

## 2022-12-01 DIAGNOSIS — L309 Dermatitis, unspecified: Secondary | ICD-10-CM

## 2022-12-01 NOTE — Patient Instructions (Signed)
Back Exercises These exercises help to make your trunk and back strong. They also help to keep the lower back flexible. Doing these exercises can help to prevent or lessen pain in your lower back. If you have back pain, try to do these exercises 2-3 times each day or as told by your doctor. As you get better, do the exercises once each day. Repeat the exercises more often as told by your doctor. To stop back pain from coming back, do the exercises once each day, or as told by your doctor. Do exercises exactly as told by your doctor. Stop right away if you feel sudden pain or your pain gets worse. Exercises Single knee to chest Do these steps 3-5 times in a row for each leg: Lie on your back on a firm bed or the floor with your legs stretched out. Bring one knee to your chest. Grab your knee or thigh with both hands and hold it in place. Pull on your knee until you feel a gentle stretch in your lower back or butt. Keep doing the stretch for 10-30 seconds. Slowly let go of your leg and straighten it. Pelvic tilt Do these steps 5-10 times in a row: Lie on your back on a firm bed or the floor with your legs stretched out. Bend your knees so they point up to the ceiling. Your feet should be flat on the floor. Tighten your lower belly (abdomen) muscles to press your lower back against the floor. This will make your tailbone point up to the ceiling instead of pointing down to your feet or the floor. Stay in this position for 5-10 seconds while you gently tighten your muscles and breathe evenly. Cat-cow Do these steps until your lower back bends more easily: Get on your hands and knees on a firm bed or the floor. Keep your hands under your shoulders, and keep your knees under your hips. You may put padding under your knees. Let your head hang down toward your chest. Tighten (contract) the muscles in your belly. Point your tailbone toward the floor so your lower back becomes rounded like the back of a  cat. Stay in this position for 5 seconds. Slowly lift your head. Let the muscles of your belly relax. Point your tailbone up toward the ceiling so your back forms a sagging arch like the back of a cow. Stay in this position for 5 seconds.  Press-ups Do these steps 5-10 times in a row: Lie on your belly (face-down) on a firm bed or the floor. Place your hands near your head, about shoulder-width apart. While you keep your back relaxed and keep your hips on the floor, slowly straighten your arms to raise the top half of your body and lift your shoulders. Do not use your back muscles. You may change where you place your hands to make yourself more comfortable. Stay in this position for 5 seconds. Keep your back relaxed. Slowly return to lying flat on the floor.  Bridges Do these steps 10 times in a row: Lie on your back on a firm bed or the floor. Bend your knees so they point up to the ceiling. Your feet should be flat on the floor. Your arms should be flat at your sides, next to your body. Tighten your butt muscles and lift your butt off the floor until your waist is almost as high as your knees. If you do not feel the muscles working in your butt and the back of   your thighs, slide your feet 1-2 inches (2.5-5 cm) farther away from your butt. Stay in this position for 3-5 seconds. Slowly lower your butt to the floor, and let your butt muscles relax. If this exercise is too easy, try doing it with your arms crossed over your chest. Belly crunches Do these steps 5-10 times in a row: Lie on your back on a firm bed or the floor with your legs stretched out. Bend your knees so they point up to the ceiling. Your feet should be flat on the floor. Cross your arms over your chest. Tip your chin a little bit toward your chest, but do not bend your neck. Tighten your belly muscles and slowly raise your chest just enough to lift your shoulder blades a tiny bit off the floor. Avoid raising your body  higher than that because it can put too much stress on your lower back. Slowly lower your chest and your head to the floor. Back lifts Do these steps 5-10 times in a row: Lie on your belly (face-down) with your arms at your sides, and rest your forehead on the floor. Tighten the muscles in your legs and your butt. Slowly lift your chest off the floor while you keep your hips on the floor. Keep the back of your head in line with the curve in your back. Look at the floor while you do this. Stay in this position for 3-5 seconds. Slowly lower your chest and your face to the floor. Contact a doctor if: Your back pain gets a lot worse when you do an exercise. Your back pain does not get better within 2 hours after you exercise. If you have any of these problems, stop doing the exercises. Do not do them again unless your doctor says it is okay. Get help right away if: You have sudden, very bad back pain. If this happens, stop doing the exercises. Do not do them again unless your doctor says it is okay. This information is not intended to replace advice given to you by your health care provider. Make sure you discuss any questions you have with your health care provider. Document Revised: 11/11/2020 Document Reviewed: 11/11/2020 Elsevier Patient Education  2023 [employer]  

## 2023-02-17 ENCOUNTER — Other Ambulatory Visit: Payer: Self-pay | Admitting: Physician Assistant

## 2023-02-17 DIAGNOSIS — R768 Other specified abnormal immunological findings in serum: Secondary | ICD-10-CM

## 2023-02-17 NOTE — Telephone Encounter (Signed)
Last Fill: 11/21/2022  Eye exam: 08/01/2022 WNL   Labs: 11/14/2022 Double-stranded DNA is positive and stable.  Calcium is slightly elevated.  Patient should avoid calcium supplement.  Hemoglobin is elevated and stable.  Urine protein creatinine ratio is normal sedimentation rate is normal and complements are normal.  Labs do not indicate an autoimmune disease flare.   Next Visit: 05/05/2023  Last Visit: 12/01/2022  DX:Autoimmune disease   Current Dose per office note 12/01/2022: Plaquenil 200 mg 1 tablet by mouth twice daily   Okay to refill Plaquenil?

## 2023-04-10 DIAGNOSIS — L03211 Cellulitis of face: Secondary | ICD-10-CM | POA: Diagnosis not present

## 2023-04-11 ENCOUNTER — Telehealth: Payer: Self-pay | Admitting: *Deleted

## 2023-04-11 NOTE — Telephone Encounter (Signed)
Patient advised she does not need to space Bactrim with hydroxychloroquine. She does not appear to have sulfa allergy so should be fine to take.   She should take Bactrim with food as it can cause GI side effects. She should also wear sunscreen due to additive risk for sun sensitivity with both Bactrim and hydroxychloroquine.   She does not need to hold hydroxychloroquine with concurrent Bactrim use.  Patient expressed understanding.

## 2023-04-11 NOTE — Telephone Encounter (Signed)
Patient does not need to space Bactrim with hydroxychloroquine. She does not appear to have sulfa allergy so should be fine to take.  She should take Bactrim with food as it can cause GI side effects. She should also wear sunscreen due to additive risk for sun sensitivity with both Bactrim and hydroxychloroquine.  She does not need to hold hydroxychloroquine with concurrent Bactrim use.  Chesley Mires, PharmD, MPH, BCPS, CPP Clinical Pharmacist (Rheumatology and Pulmonology)

## 2023-04-11 NOTE — Telephone Encounter (Signed)
Patient contacted the office stating she is currently on PLQ. Patient states she was given a prescription for Bactrim DS to take twice daily for 7 days. Patient would like to know if she needs to space her dosing of PLQ and Bactrim DS. If so how far apart does she need to space them. Please advise.

## 2023-04-16 ENCOUNTER — Other Ambulatory Visit: Payer: Self-pay | Admitting: Internal Medicine

## 2023-04-24 DIAGNOSIS — L309 Dermatitis, unspecified: Secondary | ICD-10-CM | POA: Diagnosis not present

## 2023-04-24 NOTE — Progress Notes (Signed)
Office Visit Note  Patient: Ariana Scott             Date of Birth: [birth year]           MRN: [id number]             PCP: Tracey Harries, MD Referring: No ref. provider found Visit Date: 05/05/2023 Occupation: @GUAROCC @  Subjective:  Recurrent rash   History of Present Illness: Ariana Scott is a 27 y.o. female with history of autoimmune disease.  Patient is currently taking Plaquenil 200 mg 1 tablet by mouth twice daily.  She continues to tolerate Plaquenil without any side effects and has not missed any doses recently.  Patient states she has been experiencing recurrent facial rash with onset of 02/23/2023.  She initially thought the rash was consistent with rosacea but progressed to the point where she noticed swollen lymph nodes and some pustules on both cheeks.  She had a virtual visit with her PCP and was started on oral antibiotics as well as topical antibiotic ointment.  The rash has persisted so she was given a course of prednisone which she has not yet started due to wanting to be evaluated in our office as well as to have updated lab work prior to starting a steroid.  Patient brought pictures of the progression of the rash since June and plans on uploading these photos to my chart.  Patient states that at night the facial rash is worse to the point that she has warmth and flushing.  She has also had hives on her chest and neck intermittently.   Activities of Daily Living:  Patient reports morning stiffness for less than 1 hour.   Patient Denies nocturnal pain.  Difficulty dressing/grooming: Denies Difficulty climbing stairs: Denies Difficulty getting out of chair: Denies Difficulty using hands for taps, buttons, cutlery, and/or writing: Denies  Review of Systems  Constitutional:  Positive for fatigue.  HENT:  Positive for mouth dryness. Negative for mouth sores.   Eyes:  Positive for photophobia and dryness.  Respiratory:  Negative for shortness of breath.    Cardiovascular:  Negative for chest pain and palpitations.  Gastrointestinal:  Positive for constipation and diarrhea. Negative for blood in stool.  Endocrine: Negative for increased urination.  Genitourinary:  Negative for involuntary urination.  Musculoskeletal:  Positive for joint pain, joint pain and morning stiffness. Negative for gait problem, joint swelling, myalgias, muscle weakness, muscle tenderness and myalgias.  Skin:  Positive for rash and sensitivity to sunlight. Negative for color change and hair loss.  Allergic/Immunologic: Positive for susceptible to infections.  Neurological:  Positive for headaches. Negative for dizziness.  Hematological:  Negative for swollen glands.  Psychiatric/Behavioral:  Negative for depressed mood and sleep disturbance. The patient is nervous/anxious.     PMFS History:  Patient Active Problem List   Diagnosis Date Noted   Abnormal MRI 11/18/2021   Vision changes 11/18/2021   Chronic migraine w/o aura w/o status migrainosus, not intractable 11/18/2021   Hypothyroidism due to Hashimoto's thyroiditis 03/24/2020   Hair loss 08/13/2019   Chronic midline low back pain without sciatica 07/08/2019   Elevated LFTs 07/08/2019   Fatty liver 07/08/2019   Autoimmune disease (HCC) 07/08/2019   Anxiety 05/01/2019   Lichen planus 05/01/2019   Other acne 05/01/2019   Hypothyroidism (acquired) 01/24/2019   Dry skin 08/14/2018   Erosive lichen planus of vulva 09/13/2015   Diarrhea 09/12/2014   Cervical sprain 09/11/2013    Past Medical History:  Diagnosis  Date   Autoimmune disease (HCC)    Eczema    Erosive lichen planus of vulva    Hashimoto's disease    History of rosacea    Hypothyroidism    IBS (irritable bowel syndrome)    Migraines    Polycystic ovarian syndrome     Family History  Problem Relation Age of Onset   Diabetes Mother    Depression Mother    ADD / ADHD Mother    Depression Father    Vitamin D deficiency Father    Colitis  Father    Hypertension Father    High Cholesterol Father    ADD / ADHD Brother    Past Surgical History:  Procedure Laterality Date   COLONOSCOPY  2020   Dr. Loreta Ave   eyelid surgery Right 03/26/2021   upper eyelid surgery   UPPER GASTROINTESTINAL ENDOSCOPY  2020   Dr. Loreta Ave   WISDOM TOOTH EXTRACTION  2016   Social History   Social History Narrative   Not on file   Immunization History  Administered Date(s) Administered   COVID-19, mRNA, vaccine(Comirnaty)12 years and older 08/17/2022   DTaP 10/16/1995, 11/29/1995, 01/11/1996, 01/10/1997, 04/28/2000   HIB (PRP-OMP) 10/16/1995, 11/29/1995, 01/11/1996, 01/10/1997   HPV Quadrivalent 05/21/2007, 07/03/2008   Hepatitis A 05/21/2007, 07/03/2008   Hepatitis B Dec 25, 1994, 08/01/1995, 01/11/1996   IPV 10/16/1995, 11/29/1995, 01/11/1996, 04/28/2000   MMR 07/02/1996, 04/28/2000   Meningococcal Conjugate 05/21/2007   PFIZER(Purple Top)SARS-COV-2 Vaccination 12/07/2019, 12/31/2019, 08/18/2020   Pfizer Covid-19 Vaccine Bivalent Booster 23yrs & up 06/08/2021   Tdap 04/23/2007     Objective: Vital Signs: BP 128/88 (BP Location: Left Arm, Patient Position: Sitting, Cuff Size: Normal)   Pulse 73   Resp 17   Ht 5\' 5"  (1.651 m)   Wt 206 lb 9.6 oz (93.7 kg)   BMI 34.38 kg/m    Physical Exam Vitals and nursing note reviewed.  Constitutional:      Appearance: She is well-developed.  HENT:     Head: Normocephalic and atraumatic.  Eyes:     Conjunctiva/sclera: Conjunctivae normal.  Cardiovascular:     Rate and Rhythm: Normal rate and regular rhythm.     Heart sounds: Normal heart sounds.  Pulmonary:     Effort: Pulmonary effort is normal.     Breath sounds: Normal breath sounds.  Abdominal:     General: Bowel sounds are normal.     Palpations: Abdomen is soft.  Musculoskeletal:     Cervical back: Normal range of motion.  Lymphadenopathy:     Cervical: Cervical adenopathy (left submandibular LN) present.  Skin:    General: Skin  is warm and dry.     Capillary Refill: Capillary refill takes less than 2 seconds.     Comments: Erythematous, raised lesions in malar distribution   Neurological:     Mental Status: She is alert and oriented to person, place, and time.  Psychiatric:        Behavior: Behavior normal.      Musculoskeletal Exam: C-spine, thoracic spine, and lumbar spine good ROM.  Shoulder joints, elbow joints, wrist joints, MCPs, PIPs, DIPs have good range of motion with no synovitis.  Complete fist formation bilaterally.  Hip joints have good range of motion with no groin pain.  Knee joints have good range of motion with no warmth or effusion.   CDAI Exam: CDAI Score: -- Patient Global: --; Provider Global: -- Swollen: --; Tender: -- Joint Exam 05/05/2023   No joint exam has been  documented for this visit   There is currently no information documented on the homunculus. Go to the Rheumatology activity and complete the homunculus joint exam.  Investigation: No additional findings.  Imaging: No results found.  Recent Labs: Lab Results  Component Value Date   WBC 5.9 11/14/2022   HGB 15.9 (H) 11/14/2022   PLT 247 11/14/2022   NA 140 11/14/2022   K 4.2 11/14/2022   CL 103 11/14/2022   CO2 27 11/14/2022   GLUCOSE 83 11/14/2022   BUN 10 11/14/2022   CREATININE 0.82 11/14/2022   BILITOT 0.6 11/14/2022   AST 20 11/14/2022   ALT 13 11/14/2022   PROT 7.8 11/14/2022   CALCIUM 10.3 (H) 11/14/2022   GFRAA 133 03/01/2021    Speciality Comments: PLQ eye exam: 08/01/2022 WNL [facility] Follow up in 1 year.                              Procedures:  No procedures performed Allergies: Clindamycin, Doxycycline, Cefuroxime, Kiwi extract, and Pineapple     Assessment / Plan:     Visit Diagnoses: Autoimmune disease (HCC) - Positive ANA, double-stranded DNA, positive Ro, history of photosensitivity, facial rash and elevated LFTs:  Patient presents today for further evaluation of a persistent  facial rash she has been experiencing--onset 02/23/2023.  Patient works outdoors but tries to wear hat as well as sunscreen on a daily basis.  She remains on Plaquenil 200 mg 1 tablet by mouth twice daily without any interruptions.  She continues to tolerate Plaquenil without any side effects.  She initially thought the facial rash was consistent with rosacea but the rash progressively worsened to the point that she was having cervical lymphadenopathy.  She had a virtual visit with her PCP and was started on a oral antibiotic as well as an antibiotic ointment which helped to resolve the lymphadenopathy but did not improve the rash.  Reviewed photos on the patients phone of the progression of the rash.  On examination today she continues to have facial erythema in the Malar distribution with some elevated and raised lesions.  No pustular lesions noted.  A left submandibular lymph node was palpable today.  Discussed the concern for pustular rosacea versus a malar rash related to underlying autoimmune disease.  The rash is difficult to discern given her history of rosacea and eczema. Lab work from 11/14/22 was reviewed with the patient today in the office: Sed rate within normal limits, complements within normal limits, double-stranded DNA 44 (dsDNA 50 on 08/16/22 and 31 on 09/24/21), no proteinuria.  dsDNA has remained elevated despite taking plaquenil as prescribed. Plan to obtain the following lab work today for further evaluation.   Different treatment options were discussed today in detail. Plan to initiate a trial of Imuran pending TPMT results and baseline immunosuppressive labs.  Dose of imuran: 50 mg 1 tablet daily x2 weeks and if labs are stable increase to 100 mg daily. She will require updated lab work in 2 weeks x2, 2 months, then every 3 months.  Standing orders for CBC and CMP placed today.  A prednisone taper starting at 20 mg tapering by 5 mg every 7 days was sent to the pharmacy.  Instructions were  provided including taking prednisone first thing in the morning with food and to avoid the use of NSAIDs. She will remain on plaquenil as prescribed.   Patient was advised to notify us if her  symptoms persist or worsen or if she cannot tolerate taking Imuran.  She will follow-up in the office in 8 weeks.  - Plan: CBC with Differential/Platelet, COMPLETE METABOLIC PANEL WITH GFR, Protein / creatinine ratio, urine, Anti-DNA antibody, double-stranded, C3 and C4, Sedimentation rate, ANA, VITAMIN D 25 Hydroxy (Vit-D Deficiency, Fractures), Anti-Smith antibody, RNP Antibody, Sjogrens syndrome-A extractable nuclear antibody, Sjogrens syndrome-B extractable nuclear antibody  Baseline Immunosuppressant Therapy Labs: Pending     Latest Ref Rng & Units 11/14/2022    3:27 PM  Serum Protein Electrophoresis  Total Protein 6.1 - 8.1 g/dL 7.8     TPMT: pending   Patient was counseled on the purpose, proper use, and adverse effects of azathioprine including risk of infection, nausea, rash, and hair loss.  Also informed that medication can cause discoloration of urine, sweat and tears. Reviewed risk of cancer after long term use.  Discussed risk of myelosupression and reviewed importance of frequent lab work to monitor blood counts.  Standing orders placed. Reviewed drug-drug interactions including contraindication with allopurinol.  Provided patient with educational materials on azathioprine and answered all questions.  Patient consented to azathioprine.  Will upload consent into the media tab.   Patient dose will be 50 mg daily x2 weeks and if labs are stable increase to 100 mg daily.  Prescription will be sent to pharmacy pending lab results and insurance approval.   High risk medication use - Plaquenil 200 mg 1 tablet by mouth twice daily started in September 2020.  PLQ eye exam: 08/01/2022 [facility] Follow up in 1 year.  CBC and CMP were drawn on 11/14/2022.  Orders for CBC and CMP were released  today. - Plan: CBC with Differential/Platelet, COMPLETE METABOLIC PANEL WITH GFR, Hepatitis B core antibody, IgM, Hepatitis B surface antigen, Hepatitis C antibody, HIV Antibody (routine testing w rflx), QuantiFERON-TB Gold Plus, Serum protein electrophoresis with reflex, IgG, IgA, IgM, Thiopurine methyltransferase(tpmt)rbc, CBC with Differential/Platelet, COMPLETE METABOLIC PANEL WITH GFR  Facial rash: Onset 02/23/23-reviewed many photos of the progression of the rash on the patient's phone today.  Requested for the patient to upload photos to mychart.   Concern for malar rash secondary to autoimmune disease vs. Pustular rosacea.  Tried oral bactrim and topical antibiotic ointment.   Erythematous raise lesions in malar distribution noted on exam today.  One submandibular swollen LN noted on left side.  Erythematous papules noted on chest.   Plan to initiate trial of Imuran as discussed above. She will continue on plaquenil as prescribed.   A prescription for prednisone was sent to the pharmacy today.   She will notify us if her symptoms persist or worsen.    Fatty liver - Right upper quadrant abdominal ultrasound performed on 04/02/2019.  LFTs within normal limits on 11/14/2022. LFTs WNL on 11/14/22.   Chronic SI joint pain: Left-Patient continues to experience intermittent bouts of stiffness and discomfort in her lower back, especially first thing in the morning.  No midline spinal tenderness or SI joint tenderness noted today.   Other medical conditions are listed as follows:   Eczema of both hands: Not currently symptomatic.   Other fatigue  Anxiety and depression  Lichen planus  Vitamin D deficiency -Plan to check vitamin D today.  Plan: VITAMIN D 25 Hydroxy (Vit-D Deficiency, Fractures)  History of hypothyroidism  Hx of migraines  Orders: Orders Placed This Encounter  Procedures   CBC with Differential/Platelet   COMPLETE METABOLIC PANEL WITH GFR   Protein / creatinine ratio,  urine   Anti-DNA antibody, double-stranded   C3 and C4   Sedimentation rate   ANA   VITAMIN D 25 Hydroxy (Vit-D Deficiency, Fractures)   Hepatitis B core antibody, IgM   Hepatitis B surface antigen   Hepatitis C antibody   HIV Antibody (routine testing w rflx)   QuantiFERON-TB Gold Plus   Serum protein electrophoresis with reflex   IgG, IgA, IgM   Thiopurine methyltransferase(tpmt)rbc   Anti-Smith antibody   RNP Antibody   Sjogrens syndrome-A extractable nuclear antibody   Sjogrens syndrome-B extractable nuclear antibody   CBC with Differential/Platelet   COMPLETE METABOLIC PANEL WITH GFR   Ambulatory referral to Dermatology   Meds ordered this encounter  Medications   predniSONE (DELTASONE) 5 MG tablet    Sig: Take 4 tabs po qd x 7 days, 3  tabs po qd x 7 days, 2  tabs po qd x 7 days, 1  tab po qd x 7 days    Dispense:  70 tablet    Refill:  0     Follow-Up Instructions: Return in about 8 weeks (around 06/30/2023) for Autoimmune Disease.   Gearldine Bienenstock, PA-C  Note - This record has been created using Dragon software.  Chart creation errors have been sought, but may not always  have been located. Such creation errors do not reflect on  the standard of medical care.

## 2023-04-26 ENCOUNTER — Telehealth: Payer: Self-pay

## 2023-04-26 DIAGNOSIS — M359 Systemic involvement of connective tissue, unspecified: Secondary | ICD-10-CM

## 2023-04-26 DIAGNOSIS — Z79899 Other long term (current) drug therapy: Secondary | ICD-10-CM

## 2023-04-26 NOTE — Telephone Encounter (Signed)
Patient contacted the office inquiring whether she should get her labs before her appointment on 05/05/2023 or at her appointment. Patient states she has been living somewhere that has severely hard water. Patient states was living with the hard water for about a month before she had a flare up. Patient states around mid June she had an eczema flare up on her cheeks that got infected. Patient states she was treated for the infection but now still has lesions on her cheeks. Patient states the lesions caused her lymph nodes to swell but they have since gone down. Patient states she also got a bug bite on her leg a while ago that got infected and was treated but still left a mark. Patient states she was prescribed Prednisone for her recent flare but has not taken it because she does not want it to interfere with her lab results. Patient would like to know if she needs to get labs done before her appointment so that they can be resulted by 05/05/2023 or if she should just get labs drawn at her appointment. Patient call back number is [phone]. Please advise.

## 2023-04-26 NOTE — Telephone Encounter (Signed)
Patient advised Ok to obtain updated lab work prior to her appointment so we can go over results together. Faxed lab orders to [facility] at [phone].

## 2023-04-26 NOTE — Addendum Note (Signed)
Addended by: Henriette Combs on: 04/26/2023 01:25 PM   Modules accepted: Orders

## 2023-04-26 NOTE — Telephone Encounter (Signed)
Ok to obtain updated lab work prior to her appointment so we can go over results together.

## 2023-05-05 ENCOUNTER — Telehealth: Payer: Self-pay

## 2023-05-05 ENCOUNTER — Ambulatory Visit: Payer: Federal, State, Local not specified - PPO | Attending: Physician Assistant | Admitting: Physician Assistant

## 2023-05-05 ENCOUNTER — Encounter: Payer: Self-pay | Admitting: Physician Assistant

## 2023-05-05 VITALS — BP 128/88 | HR 73 | Resp 17 | Ht 65.0 in | Wt 206.6 lb

## 2023-05-05 DIAGNOSIS — M533 Sacrococcygeal disorders, not elsewhere classified: Secondary | ICD-10-CM | POA: Diagnosis not present

## 2023-05-05 DIAGNOSIS — E559 Vitamin D deficiency, unspecified: Secondary | ICD-10-CM

## 2023-05-05 DIAGNOSIS — K760 Fatty (change of) liver, not elsewhere classified: Secondary | ICD-10-CM

## 2023-05-05 DIAGNOSIS — Z8669 Personal history of other diseases of the nervous system and sense organs: Secondary | ICD-10-CM

## 2023-05-05 DIAGNOSIS — R5383 Other fatigue: Secondary | ICD-10-CM

## 2023-05-05 DIAGNOSIS — Z79899 Other long term (current) drug therapy: Secondary | ICD-10-CM

## 2023-05-05 DIAGNOSIS — M359 Systemic involvement of connective tissue, unspecified: Secondary | ICD-10-CM

## 2023-05-05 DIAGNOSIS — Z1159 Encounter for screening for other viral diseases: Secondary | ICD-10-CM | POA: Diagnosis not present

## 2023-05-05 DIAGNOSIS — G8929 Other chronic pain: Secondary | ICD-10-CM

## 2023-05-05 DIAGNOSIS — F32A Depression, unspecified: Secondary | ICD-10-CM

## 2023-05-05 DIAGNOSIS — L439 Lichen planus, unspecified: Secondary | ICD-10-CM

## 2023-05-05 DIAGNOSIS — F419 Anxiety disorder, unspecified: Secondary | ICD-10-CM

## 2023-05-05 DIAGNOSIS — Z8639 Personal history of other endocrine, nutritional and metabolic disease: Secondary | ICD-10-CM

## 2023-05-05 DIAGNOSIS — R21 Rash and other nonspecific skin eruption: Secondary | ICD-10-CM

## 2023-05-05 DIAGNOSIS — L309 Dermatitis, unspecified: Secondary | ICD-10-CM

## 2023-05-05 MED ORDER — PREDNISONE 5 MG PO TABS: ORAL_TABLET | ORAL | 0 refills | Status: AC

## 2023-05-05 NOTE — Telephone Encounter (Signed)
Pending lab results, patient will be starting Imuran per Sherron Ales, PA-C. Thanks!   Consent obtained and sent to the scan center.

## 2023-05-05 NOTE — Patient Instructions (Addendum)
Standing Labs We placed an order today for your standing lab work.   Please have your standing labs drawn in 2 weeks x2, 2 months and then every 3 months.   Please have your labs drawn 2 weeks prior to your appointment so that the provider can discuss your lab results at your appointment, if possible.  Please note that you may see your imaging and lab results in MyChart before we have reviewed them. We will contact you once all results are reviewed. Please allow our office up to 72 hours to thoroughly review all of the results before contacting the office for clarification of your results.  WALK-IN LAB HOURS  Monday through Thursday from 8:00 am -12:30 pm and 1:00 pm-5:00 pm and Friday from 8:00 am-12:00 pm.  Patients with office visits requiring labs will be seen before walk-in labs.  You may encounter longer than normal wait times. Please allow additional time. Wait times may be shorter on  Monday and Thursday afternoons.  We do not book appointments for walk-in labs. We appreciate your patience and understanding with our staff.   Labs are drawn by [facility]. Please bring your co-pay at the time of your lab draw.  You may receive a bill from [facility] for your lab work.  Please note if you are on Hydroxychloroquine and and an order has been placed for a Hydroxychloroquine level,  you will need to have it drawn 4 hours or more after your last dose.  If you wish to have your labs drawn at another location, please call the office 24 hours in advance so we can fax the orders.  The office is located at [address], [address], [place], [place] [address]   If you have any questions regarding directions or hours of operation,  please call [phone].   As a reminder, please drink plenty of water prior to coming for your lab work. Thanks!     Azathioprine Tablets What is this medication? AZATHIOPRINE (ay za THYE oh preen) prevents the body from rejecting an organ transplant. It works by  lowering the body's immune system response. This helps the body accept the donor organ. It may also be used to treat rheumatoid arthritis. This medicine may be used for other purposes; ask your health care provider or pharmacist if you have questions. COMMON BRAND NAME(S): Azasan, Imuran What should I tell my care team before I take this medication? They need to know if you have any of these conditions: Infection Kidney disease Liver disease An unusual or allergic reaction to azathioprine, lactose, other medications, foods, dyes, or preservatives Pregnant or trying to get pregnant Breastfeeding How should I use this medication? Take this medication by mouth with a full glass of water. Take it as directed on the prescription label at the same time every day. Keep taking it unless your care team tells you to stop. Keep taking it even if you think you are better. Talk to your care team about the use of this medication in children. Special care may be needed. Overdosage: If you think you have taken too much of this medicine contact a poison control center or emergency room at once. NOTE: This medicine is only for you. Do not share this medicine with others. What if I miss a dose? If you miss a dose, take it as soon as you can. If it is almost time for your next dose, take only that dose. Do not take double or extra doses. What may interact with  this medication? Do not take this medication with any of the following: Febuxostat Mercaptopurine This medication may also interact with the following: Allopurinol Aminosalicylates, such as sulfasalazine, mesalamine, balsalazide, and olsalazine Leflunomide Medications called ACE inhibitors, such as benazepril, captopril, enalapril, fosinopril, quinapril, lisinopril, ramipril, and trandolapril Mycophenolate Sulfamethoxazole; trimethoprim Vaccines Warfarin This list may not describe all possible interactions. Give your health care provider a list of  all the medicines, herbs, non-prescription drugs, or dietary supplements you use. Also tell them if you smoke, drink alcohol, or use illegal drugs. Some items may interact with your medicine. What should I watch for while using this medication? Visit your care team for regular checks on your progress. You may need blood work done while you are taking this medication. This medication may increase your risk of getting an infection. Call your care team for advice if you get a fever, chills, sore throat, or other symptoms of a cold or flu. Do not treat yourself. Try to avoid being around people who are sick. Talk to your care team about your risk of cancer. You may be more at risk for certain types of cancer if you take this medication. Talk to your care team if you may be pregnant. This medication can cause serious birth defects if taken during pregnancy. This medication may cause infertility. Talk to your care team if you are concerned about your fertility. What side effects may I notice from receiving this medication? Side effects that you should report to your care team as soon as possible: Allergic reactions--skin rash, itching, hives, swelling of the face, lips, tongue, or throat Change in your skin, such as a new growth, a sore that doesn't heal, or a change in a mole Dizziness, loss of balance or coordination, confusion or trouble speaking Infection--fever, chills, cough, sore throat, wounds that don't heal, pain or trouble when passing urine, general feeling of discomfort or being unwell Low red blood cell level--unusual weakness or fatigue, dizziness, headache, trouble breathing Unusual bruising or bleeding Side effects that usually do not require medical attention (report to your care team if they continue or are bothersome): Diarrhea Fatigue Nausea Vomiting This list may not describe all possible side effects. Call your doctor for medical advice about side effects. You may report side  effects to FDA at 1-800-FDA-1088. Where should I keep my medication? Keep out of the reach of children and pets. Store at room temperature between 15 and 25 degrees C (59 and 77 degrees F). Protect from light. Get rid of any unused medication after the expiration date. To get rid of medications that are no longer needed or have expired: Take the medication to a medication take-back program. Check with your pharmacy or law enforcement to find a location. If you cannot return the medication, check the label or package insert to see if the medication should be thrown out in the garbage or flushed down the toilet. If you are not sure, ask your care team. If it is safe to put it in the trash, empty the medication out of the container. Mix the medication with cat litter, dirt, coffee grounds, or other unwanted substance. Seal the mixture in a bag or container. Put it in the trash. NOTE: This sheet is a summary. It may not cover all possible information. If you have questions about this medicine, talk to your doctor, pharmacist, or health care provider.  2024 Elsevier/Gold Standard (2022-02-01 00:00:00)

## 2023-05-11 ENCOUNTER — Other Ambulatory Visit: Payer: Self-pay | Admitting: Physician Assistant

## 2023-05-11 DIAGNOSIS — R768 Other specified abnormal immunological findings in serum: Secondary | ICD-10-CM

## 2023-05-11 NOTE — Telephone Encounter (Signed)
Last Fill: 02/17/2023  Eye exam: 08/01/2022 WNL    Labs: 05/05/2023 Creat. 0.97, RBC 5.27, Hgb 15.6, Hct 45.5  Next Visit: 07/03/2023  Last Visit: 05/05/2023  DX:  Autoimmune disease   Current Dose per office note 05/05/2023: Plaquenil 200 mg 1 tablet by mouth twice daily   Okay to refill Plaquenil?

## 2023-05-12 ENCOUNTER — Other Ambulatory Visit: Payer: Self-pay | Admitting: *Deleted

## 2023-05-12 DIAGNOSIS — E559 Vitamin D deficiency, unspecified: Secondary | ICD-10-CM

## 2023-05-12 LAB — COMPLETE METABOLIC PANEL WITH GFR
AG Ratio: 1.5 (calc) (ref 1.0–2.5)
ALT: 14 U/L (ref 6–29)
AST: 20 U/L (ref 10–30)
Albumin: 4.5 g/dL (ref 3.6–5.1)
Alkaline phosphatase (APISO): 40 U/L (ref 31–125)
BUN/Creatinine Ratio: 8 (calc) (ref 6–22)
BUN: 8 mg/dL (ref 7–25)
CO2: 23 mmol/L (ref 20–32)
Calcium: 10 mg/dL (ref 8.6–10.2)
Chloride: 103 mmol/L (ref 98–110)
Creat: 0.97 mg/dL — ABNORMAL HIGH (ref 0.50–0.96)
Globulin: 3.1 g/dL (ref 1.9–3.7)
Glucose, Bld: 82 mg/dL (ref 65–99)
Potassium: 4.3 mmol/L (ref 3.5–5.3)
Sodium: 139 mmol/L (ref 135–146)
Total Bilirubin: 0.3 mg/dL (ref 0.2–1.2)
Total Protein: 7.6 g/dL (ref 6.1–8.1)
eGFR: 82 mL/min/{1.73_m2} (ref >=60)

## 2023-05-12 LAB — CBC WITH DIFFERENTIAL/PLATELET
Absolute Monocytes: 611 {cells}/uL (ref 200–950)
Basophils Absolute: 28 {cells}/uL (ref 0–200)
Basophils Relative: 0.5 %
Eosinophils Absolute: 39 {cells}/uL (ref 15–500)
Eosinophils Relative: 0.7 %
HCT: 45.5 % — ABNORMAL HIGH (ref 35.0–45.0)
Hemoglobin: 15.6 g/dL — ABNORMAL HIGH (ref 11.7–15.5)
Lymphs Abs: 1452 {cells}/uL (ref 850–3900)
MCH: 29.6 pg (ref 27.0–33.0)
MCHC: 34.3 g/dL (ref 32.0–36.0)
MCV: 86.3 fL (ref 80.0–100.0)
MPV: 9.6 fL (ref 7.5–12.5)
Monocytes Relative: 11.1 %
Neutro Abs: 3372 {cells}/uL (ref 1500–7800)
Neutrophils Relative %: 61.3 %
Platelets: 277 10*3/uL (ref 140–400)
RBC: 5.27 10*6/uL — ABNORMAL HIGH (ref 3.80–5.10)
RDW: 12.8 % (ref 11.0–15.0)
Total Lymphocyte: 26.4 %
WBC: 5.5 10*3/uL (ref 3.8–10.8)

## 2023-05-12 LAB — QUANTIFERON-TB GOLD PLUS
Mitogen-NIL: >10 [IU]/mL
NIL: 0.02 [IU]/mL
QuantiFERON-TB Gold Plus: NEGATIVE
TB1-NIL: 0 [IU]/mL
TB2-NIL: 0 [IU]/mL

## 2023-05-12 LAB — HIV ANTIBODY (ROUTINE TESTING W REFLEX): HIV 1&2 Ab, 4th Generation: NONREACTIVE

## 2023-05-12 LAB — ANTI-SMITH ANTIBODY: ENA SM Ab Ser-aCnc: <1 AI

## 2023-05-12 LAB — C3 AND C4
C3 Complement: 159 mg/dL (ref 83–193)
C4 Complement: 29 mg/dL (ref 15–57)

## 2023-05-12 LAB — RNP ANTIBODY: Ribonucleic Protein(ENA) Antibody, IgG: <1 AI

## 2023-05-12 LAB — SJOGRENS SYNDROME-A EXTRACTABLE NUCLEAR ANTIBODY: SSA (Ro) (ENA) Antibody, IgG: 7.1 AI — AB

## 2023-05-12 LAB — THIOPURINE METHYLTRANSFERASE (TPMT), RBC: Thiopurine Methyltransferase, RBC: 7 nmol/h/mL — ABNORMAL LOW

## 2023-05-12 LAB — PROTEIN ELECTROPHORESIS, SERUM, WITH REFLEX
Albumin ELP: 4.4 g/dL (ref 3.8–4.8)
Alpha 1: 0.3 g/dL (ref 0.2–0.3)
Alpha 2: 0.7 g/dL (ref 0.5–0.9)
Beta 2: 0.5 g/dL (ref 0.2–0.5)
Beta Globulin: 0.5 g/dL (ref 0.4–0.6)
Gamma Globulin: 1.2 g/dL (ref 0.8–1.7)
Total Protein: 7.6 g/dL (ref 6.1–8.1)

## 2023-05-12 LAB — PROTEIN / CREATININE RATIO, URINE
Creatinine, Urine: 96 mg/dL (ref 20–275)
Protein/Creat Ratio: 73 mg/g{creat} (ref 24–184)
Protein/Creatinine Ratio: 0.073 mg/mg{creat} (ref 0.024–0.184)
Total Protein, Urine: 7 mg/dL (ref 5–24)

## 2023-05-12 LAB — VITAMIN D 25 HYDROXY (VIT D DEFICIENCY, FRACTURES): Vit D, 25-Hydroxy: 27 ng/mL — ABNORMAL LOW (ref 30–100)

## 2023-05-12 LAB — IGG, IGA, IGM
IgG (Immunoglobin G), Serum: 1274 mg/dL (ref 600–1640)
IgM, Serum: 114 mg/dL (ref 50–300)
Immunoglobulin A: 367 mg/dL — ABNORMAL HIGH (ref 47–310)

## 2023-05-12 LAB — ANTI-NUCLEAR AB-TITER (ANA TITER)
ANA TITER: 1:80 {titer} — ABNORMAL HIGH
ANA Titer 1: 1:40 {titer} — ABNORMAL HIGH

## 2023-05-12 LAB — HEPATITIS C ANTIBODY: Hepatitis C Ab: NONREACTIVE

## 2023-05-12 LAB — HEPATITIS B SURFACE ANTIGEN: Hepatitis B Surface Ag: NONREACTIVE

## 2023-05-12 LAB — ANTI-DNA ANTIBODY, DOUBLE-STRANDED: ds DNA Ab: 36 [IU]/mL — ABNORMAL HIGH

## 2023-05-12 LAB — SEDIMENTATION RATE: Sed Rate: 17 mm/h (ref 0–20)

## 2023-05-12 LAB — HEPATITIS B CORE ANTIBODY, IGM: Hep B C IgM: NONREACTIVE

## 2023-05-12 LAB — SJOGRENS SYNDROME-B EXTRACTABLE NUCLEAR ANTIBODY: SSB (La) (ENA) Antibody, IgG: <1 AI

## 2023-05-12 LAB — ANA: Anti Nuclear Antibody (ANA): POSITIVE — AB

## 2023-05-12 NOTE — Progress Notes (Signed)
I called the patient to review lab results.  Double-stranded 9 has trended down to 36.  Sed rate was within normal limits.  Complements within normal limits.  No protein in the urine was noted.  ANA and Ro antibody remains positive.  La antibody, Smith antibody, and RNP antibody were negative. TPMT low Hepatitis B&C negative.  HIV negative.  TB Gold negative.  SPEP did not reveal any abnormal protein bands.  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  Do not plan on adding Imuran at this time especially since her TPMT is low.   Vitamin D was low at 27.  She has been taking vitamin D 2000 units every other day.  Patient was encouraged to take vitamin D 2000 units daily.  Will recheck vitamin D in 3 months.  She will notify us if she develops any new or worsening symptoms.  She plans on taking the prednisone taper soon to see if it will help to alleviate the rash on her face.

## 2023-05-16 NOTE — Telephone Encounter (Signed)
Per Sherron Ales, PA-C  Do not plan on adding Imuran at this time especially since her TPMT is low.

## 2023-05-19 ENCOUNTER — Telehealth: Payer: Self-pay

## 2023-05-19 DIAGNOSIS — M359 Systemic involvement of connective tissue, unspecified: Secondary | ICD-10-CM | POA: Diagnosis not present

## 2023-05-19 DIAGNOSIS — M3501 Sicca syndrome with keratoconjunctivitis: Secondary | ICD-10-CM | POA: Diagnosis not present

## 2023-05-19 DIAGNOSIS — K117 Disturbances of salivary secretion: Secondary | ICD-10-CM | POA: Diagnosis not present

## 2023-05-19 NOTE — Telephone Encounter (Signed)
Patient states she went to urgent care for the sjogren's like symptoms she has been experiencing and was prescribed pilocarpine 5mg - four times daily for 60 days and carboxymethylcellulose sodium 0.25% eye drops TID in both eyes for 21 days. Patient would like to know if these medications are okay to take with her conditions and other medications. Advised patient that it would be Monday before we could return the call since Ladona Ridgel and Dr. Corliss Skains are out of the office but I could have our pharmacist contact her. Patient states she would like to speak with the pharmacist in regards to drug interactions but still requests Taylor's feedback. Please advise.

## 2023-05-19 NOTE — Telephone Encounter (Signed)
Patient states her dry mouth and dry eyes started with what she believes were inflamed tonsils. She has been taking OTC tear drops which have minimally helped. Advised that she can try what urgent care has prescribed but if she's been taking OTC formulation then this also has carboxymethylcellulose  Reviewed that pilocarpine and carboxymethylcellulose eye drop dose would be appropriate for her symptoms and there is no interaction with hydroxychloroquine or her other current meds. She has f/u w Ladona Ridgel on 07/03/2023  Chesley Mires, PharmD, MPH, BCPS, CPP Clinical Pharmacist (Rheumatology and Pulmonology)

## 2023-05-22 NOTE — Telephone Encounter (Signed)
Patient advised Ladona Ridgel agrees with Chesley Mires, [facility] and the above treatment plan.

## 2023-05-22 NOTE — Telephone Encounter (Signed)
Agree with Chesley Mires, RPH and the above treatment plan.

## 2023-06-13 HISTORY — PX: SKIN BIOPSY: SHX1

## 2023-06-20 NOTE — Progress Notes (Deleted)
Office Visit Note  Patient: Ariana Scott             Date of Birth: 09/14/1995           MRN: [id number]             PCP: Tracey Harries, MD Referring: Tracey Harries, MD Visit Date: 07/03/2023 Occupation: @GUAROCC @  Subjective:  No chief complaint on file.   History of Present Illness: SHIRLEE WHITMIRE is a 27 y.o. female ***     Activities of Daily Living:  Patient reports morning stiffness for *** {minute/hour:19697}.   Patient {ACTIONS;DENIES/REPORTS:21021675::"Denies"} nocturnal pain.  Difficulty dressing/grooming: {ACTIONS;DENIES/REPORTS:21021675::"Denies"} Difficulty climbing stairs: {ACTIONS;DENIES/REPORTS:21021675::"Denies"} Difficulty getting out of chair: {ACTIONS;DENIES/REPORTS:21021675::"Denies"} Difficulty using hands for taps, buttons, cutlery, and/or writing: {ACTIONS;DENIES/REPORTS:21021675::"Denies"}  No Rheumatology ROS completed.   PMFS History:  Patient Active Problem List   Diagnosis Date Noted   Abnormal MRI 11/18/2021   Vision changes 11/18/2021   Chronic migraine w/o aura w/o status migrainosus, not intractable 11/18/2021   Hypothyroidism due to Hashimoto's thyroiditis 03/24/2020   Hair loss 08/13/2019   Chronic midline low back pain without sciatica 07/08/2019   Elevated LFTs 07/08/2019   Fatty liver 07/08/2019   Autoimmune disease (HCC) 07/08/2019   Anxiety 05/01/2019   Lichen planus 05/01/2019   Other acne 05/01/2019   Hypothyroidism (acquired) 01/24/2019   Dry skin 08/14/2018   Erosive lichen planus of vulva 09/13/2015   Diarrhea 09/12/2014   Cervical sprain 09/11/2013    Past Medical History:  Diagnosis Date   Autoimmune disease (HCC)    Eczema    Erosive lichen planus of vulva    Hashimoto's disease    History of rosacea    Hypothyroidism    IBS (irritable bowel syndrome)    Migraines    Polycystic ovarian syndrome     Family History  Problem Relation Age of Onset   Diabetes Mother    Depression Mother    ADD / ADHD  Mother    Depression Father    Vitamin D deficiency Father    Colitis Father    Hypertension Father    High Cholesterol Father    ADD / ADHD Brother    Past Surgical History:  Procedure Laterality Date   COLONOSCOPY  2020   Dr. Loreta Ave   eyelid surgery Right 03/26/2021   upper eyelid surgery   UPPER GASTROINTESTINAL ENDOSCOPY  2020   Dr. Loreta Ave   WISDOM TOOTH EXTRACTION  2016   Social History   Social History Narrative   Not on file   Immunization History  Administered Date(s) Administered   DTaP 10/16/1995, 11/29/1995, 01/11/1996, 01/10/1997, 04/28/2000   HIB (PRP-OMP) 10/16/1995, 11/29/1995, 01/11/1996, 01/10/1997   HPV Quadrivalent 05/21/2007, 07/03/2008   Hepatitis A 05/21/2007, 07/03/2008   Hepatitis B 16-Jan-1995, 08/01/1995, 01/11/1996   IPV 10/16/1995, 11/29/1995, 01/11/1996, 04/28/2000   MMR 07/02/1996, 04/28/2000   Meningococcal Conjugate 05/21/2007   PFIZER(Purple Top)SARS-COV-2 Vaccination 12/07/2019, 12/31/2019, 08/18/2020   Pfizer Covid-19 Vaccine Bivalent Booster 52yrs & up 06/08/2021   Pfizer(Comirnaty)Fall Seasonal Vaccine 12 years and older 08/17/2022   Tdap 04/23/2007     Objective: Vital Signs: There were no vitals taken for this visit.   Physical Exam   Musculoskeletal Exam: ***  CDAI Exam: CDAI Score: -- Patient Global: --; Provider Global: -- Swollen: --; Tender: -- Joint Exam 07/03/2023   No joint exam has been documented for this visit   There is currently no information documented on the homunculus. Go to the Rheumatology activity and  complete the homunculus joint exam.  Investigation: No additional findings.  Imaging: No results found.  Recent Labs: Lab Results  Component Value Date   WBC 5.5 05/05/2023   HGB 15.6 (H) 05/05/2023   PLT 277 05/05/2023   NA 139 05/05/2023   K 4.3 05/05/2023   CL 103 05/05/2023   CO2 23 05/05/2023   GLUCOSE 82 05/05/2023   BUN 8 05/05/2023   CREATININE 0.97 (H) 05/05/2023   BILITOT 0.3  05/05/2023   AST 20 05/05/2023   ALT 14 05/05/2023   PROT 7.6 05/05/2023   PROT 7.6 05/05/2023   CALCIUM 10.0 05/05/2023   GFRAA 133 03/01/2021   QFTBGOLDPLUS NEGATIVE 05/05/2023    Speciality Comments: PLQ eye exam: 08/01/2022 [facility] Follow up in 1 year.                              Procedures:  No procedures performed Allergies: Clindamycin, Doxycycline, Cefuroxime, Kiwi extract, and Pineapple   Assessment / Plan:     Visit Diagnoses: Autoimmune disease (HCC)  High risk medication use  Fatty liver  Facial rash  Chronic SI joint pain  Eczema of both hands  Other fatigue  Anxiety and depression  Lichen planus  History of hypothyroidism  Hx of migraines  Vitamin D deficiency  Orders: No orders of the defined types were placed in this encounter.  No orders of the defined types were placed in this encounter.   Face-to-face time spent with patient was *** minutes. Greater than 50% of time was spent in counseling and coordination of care.  Follow-Up Instructions: No follow-ups on file.   Gearldine Bienenstock, PA-C  Note - This record has been created using Dragon software.  Chart creation errors have been sought, but may not always  have been located. Such creation errors do not reflect on  the standard of medical care.

## 2023-07-03 ENCOUNTER — Ambulatory Visit: Payer: Federal, State, Local not specified - PPO | Admitting: Physician Assistant

## 2023-07-03 DIAGNOSIS — K760 Fatty (change of) liver, not elsewhere classified: Secondary | ICD-10-CM

## 2023-07-03 DIAGNOSIS — Z79899 Other long term (current) drug therapy: Secondary | ICD-10-CM

## 2023-07-03 DIAGNOSIS — R5383 Other fatigue: Secondary | ICD-10-CM

## 2023-07-03 DIAGNOSIS — M359 Systemic involvement of connective tissue, unspecified: Secondary | ICD-10-CM

## 2023-07-03 DIAGNOSIS — Z8639 Personal history of other endocrine, nutritional and metabolic disease: Secondary | ICD-10-CM

## 2023-07-03 DIAGNOSIS — Z8669 Personal history of other diseases of the nervous system and sense organs: Secondary | ICD-10-CM

## 2023-07-03 DIAGNOSIS — L439 Lichen planus, unspecified: Secondary | ICD-10-CM

## 2023-07-03 DIAGNOSIS — L309 Dermatitis, unspecified: Secondary | ICD-10-CM

## 2023-07-03 DIAGNOSIS — G8929 Other chronic pain: Secondary | ICD-10-CM

## 2023-07-03 DIAGNOSIS — R21 Rash and other nonspecific skin eruption: Secondary | ICD-10-CM

## 2023-07-03 DIAGNOSIS — E559 Vitamin D deficiency, unspecified: Secondary | ICD-10-CM

## 2023-07-03 DIAGNOSIS — F419 Anxiety disorder, unspecified: Secondary | ICD-10-CM

## 2023-07-06 NOTE — Telephone Encounter (Signed)
Are [facility] able to [facility]?

## 2023-07-07 DIAGNOSIS — D485 Neoplasm of uncertain behavior of skin: Secondary | ICD-10-CM | POA: Diagnosis not present

## 2023-07-24 DIAGNOSIS — L719 Rosacea, unspecified: Secondary | ICD-10-CM | POA: Diagnosis not present

## 2023-07-24 DIAGNOSIS — B029 Zoster without complications: Secondary | ICD-10-CM | POA: Diagnosis not present

## 2023-07-24 NOTE — Progress Notes (Signed)
Office Visit Note  Patient: Ariana Scott             Date of Birth: [birth year]           MRN: [id number]             PCP: Tracey Harries, MD Referring: Tracey Harries, MD Visit Date: 08/07/2023 Occupation: @GUAROCC @  Subjective:  Rosacea   History of Present Illness: Ariana Scott is a 27 y.o. female with history of autoimmune disease.  Patient remains on  Plaquenil 200 mg 1 tablet by mouth twice daily started in September 2020.  She is tolerating plaquenil without any side effects.  She denies any recent gaps in therapy.  She has been under the care of dermatology for management of the facial rash she has been experiencing chronically as well as a new rash on the left side of her abdomen.  Patient had a biopsy performed on her face which confirmed the diagnosis of rosacea.  She will be initiating topical ivermectin and if no response she will switch to an oral agent after 3 months.  Patient has not taken the prednisone taper prescribed after her last office visit.  She continues to have swollen lymph nodes intermittently but no cervical lymphadenopathy currently.  She has not had any oral nasal ulcerations.  She denies any symptoms of Raynaud's phenomenon.  She denies any sicca symptoms currently.  She has noticed some increased fatigue which she attributes to the time change.  She denies any increased joint pain or joint swelling.  She denies any other new medical conditions. She is scheduled for an updated plaquenil eye examination today at [facility].    Activities of Daily Living:  Patient reports morning stiffness for 30 minutes.   Patient Denies nocturnal pain.  Difficulty dressing/grooming: Denies Difficulty climbing stairs: Denies Difficulty getting out of chair: Denies Difficulty using hands for taps, buttons, cutlery, and/or writing: Denies  Review of Systems  Constitutional:  Positive for fatigue.  HENT:  Negative for mouth sores and mouth dryness.   Eyes:  Negative for  pain, visual disturbance and dryness.  Respiratory:  Negative for shortness of breath.   Cardiovascular:  Negative for chest pain and palpitations.  Gastrointestinal:  Negative for blood in stool, constipation and diarrhea.  Endocrine: Negative for increased urination.  Genitourinary:  Negative for involuntary urination.  Musculoskeletal:  Positive for joint pain, joint pain and morning stiffness. Negative for gait problem, joint swelling, myalgias, muscle weakness, muscle tenderness and myalgias.  Skin:  Positive for rash, redness and sensitivity to sunlight. Negative for color change and hair loss.  Allergic/Immunologic: Positive for susceptible to infections.  Neurological:  Positive for headaches. Negative for dizziness.  Hematological:  Positive for swollen glands.  Psychiatric/Behavioral:  Negative for depressed mood and sleep disturbance. The patient is not nervous/anxious.     PMFS History:  Patient Active Problem List   Diagnosis Date Noted   Abnormal MRI 11/18/2021   Vision changes 11/18/2021   Chronic migraine w/o aura w/o status migrainosus, not intractable 11/18/2021   Hypothyroidism due to Hashimoto's thyroiditis 03/24/2020   Hair loss 08/13/2019   Chronic midline low back pain without sciatica 07/08/2019   Elevated LFTs 07/08/2019   Fatty liver 07/08/2019   Autoimmune disease (HCC) 07/08/2019   Anxiety 05/01/2019   Lichen planus 05/01/2019   Other acne 05/01/2019   Hypothyroidism (acquired) 01/24/2019   Dry skin 08/14/2018   Erosive lichen planus of vulva 09/13/2015   Diarrhea 09/12/2014  Cervical sprain 09/11/2013    Past Medical History:  Diagnosis Date   Autoimmune disease (HCC)    Eczema    Erosive lichen planus of vulva    Hashimoto's disease    History of rosacea    Hypothyroidism    IBS (irritable bowel syndrome)    Migraines    Polycystic ovarian syndrome     Family History  Problem Relation Age of Onset   Diabetes Mother    Depression Mother     ADD / ADHD Mother    Depression Father    Vitamin D deficiency Father    Colitis Father    Hypertension Father    High Cholesterol Father    ADD / ADHD Brother    Past Surgical History:  Procedure Laterality Date   COLONOSCOPY  2020   Dr. Loreta Ave   eyelid surgery Right 03/26/2021   upper eyelid surgery   SKIN BIOPSY  06/2023   on face, [facility]   UPPER GASTROINTESTINAL ENDOSCOPY  2020   Dr. Loreta Ave   WISDOM TOOTH EXTRACTION  2016   Social History   Social History Narrative   Not on file   Immunization History  Administered Date(s) Administered   DTaP 10/16/1995, 11/29/1995, 01/11/1996, 01/10/1997, 04/28/2000   HIB (PRP-OMP) 10/16/1995, 11/29/1995, 01/11/1996, 01/10/1997   HPV Quadrivalent 05/21/2007, 07/03/2008   Hepatitis A 05/21/2007, 07/03/2008   Hepatitis B 26-May-1995, 08/01/1995, 01/11/1996   IPV 10/16/1995, 11/29/1995, 01/11/1996, 04/28/2000   MMR 07/02/1996, 04/28/2000   Meningococcal Conjugate 05/21/2007   PFIZER(Purple Top)SARS-COV-2 Vaccination 12/07/2019, 12/31/2019, 08/18/2020   Pfizer Covid-19 Vaccine Bivalent Booster 62yrs & up 06/08/2021   Pfizer(Comirnaty)Fall Seasonal Vaccine 12 years and older 08/17/2022   Tdap 04/23/2007     Objective: Vital Signs: BP 126/86 (BP Location: Left Arm, Patient Position: Sitting, Cuff Size: Large)   Pulse 76   Resp 17   Ht 5\' 5"  (1.651 m)   Wt 221 lb 12.8 oz (100.6 kg)   BMI 36.91 kg/m    Physical Exam Vitals and nursing note reviewed.  Constitutional:      Appearance: She is well-developed.  HENT:     Head: Normocephalic and atraumatic.  Eyes:     Conjunctiva/sclera: Conjunctivae normal.  Cardiovascular:     Rate and Rhythm: Normal rate and regular rhythm.     Heart sounds: Normal heart sounds.  Pulmonary:     Effort: Pulmonary effort is normal.     Breath sounds: Normal breath sounds.  Abdominal:     General: Bowel sounds are normal.     Palpations: Abdomen is soft.  Musculoskeletal:      Cervical back: Normal range of motion.  Lymphadenopathy:     Cervical: No cervical adenopathy.  Skin:    General: Skin is warm and dry.     Capillary Refill: Capillary refill takes less than 2 seconds.     Comments: Diffuse facial erythema consistent with rosacea noted  Neurological:     Mental Status: She is alert and oriented to person, place, and time.  Psychiatric:        Behavior: Behavior normal.      Musculoskeletal Exam: C-spine, thoracic spine, lumbar spine have good range of motion.  Shoulder joints, elbow joints, wrist joints, MCPs, PIPs, DIPs have good range of motion with no synovitis.  Complete fist formation bilaterally.  Hip joints have good range of motion with no groin pain.  Knee joints have good range of motion no warmth or effusion.  Ankle joints have  good range of motion with no tenderness or joint swelling.  CDAI Exam: CDAI Score: -- Patient Global: --; Provider Global: -- Swollen: --; Tender: -- Joint Exam 08/07/2023   No joint exam has been documented for this visit   There is currently no information documented on the homunculus. Go to the Rheumatology activity and complete the homunculus joint exam.  Investigation: No additional findings.  Imaging: No results found.  Recent Labs: Lab Results  Component Value Date   WBC 5.5 05/05/2023   HGB 15.6 (H) 05/05/2023   PLT 277 05/05/2023   NA 139 05/05/2023   K 4.3 05/05/2023   CL 103 05/05/2023   CO2 23 05/05/2023   GLUCOSE 82 05/05/2023   BUN 8 05/05/2023   CREATININE 0.97 (H) 05/05/2023   BILITOT 0.3 05/05/2023   AST 20 05/05/2023   ALT 14 05/05/2023   PROT 7.6 05/05/2023   PROT 7.6 05/05/2023   CALCIUM 10.0 05/05/2023   GFRAA 133 03/01/2021   QFTBGOLDPLUS NEGATIVE 05/05/2023    Speciality Comments: PLQ eye exam: 08/01/2022 [facility] Follow up in 1 year.                              Procedures:  No procedures performed Allergies: Clindamycin, Doxycycline, Cefuroxime, Kiwi  extract, and Pineapple     Assessment / Plan:     Visit Diagnoses: Autoimmune disease (HCC) - Positive ANA, double-stranded DNA, positive Ro, history of photosensitivity, facial rash and elevated LFTs: Patient remains on Plaquenil 200 mg 1 tablet by mouth twice daily.  She is tolerating Plaquenil without any side effects and has not had any gaps in therapy.  The patient has been under the care of dermatology and had a recent facial biopsy consistent with rosacea on 07/07/23.  She will be initiating ivermectin ointment under dermatologist care.  If she has an inadequate response the plan is to switch to an oral agent after 3 months if needed.  Discussed that since her facial rash is consistent with rosacea rather than due to underlying lupus I would recommend holding off on adding Imuran at this time.  She was in agreement.  She has not developed any other new or worsening symptoms since her last office visit. Lab work from 05/05/23 was reviewed today in the office: ANA 1:40NH, 1:80 nuclear fine speckled, Ro 7.1, RNP-, Sm-, SPEP normal, ESR WNL, complements WNL, dsDNA 36, and protein creatinine ratio WNL.  Plan to obtain the following lab work today for further evaluation.  She will remain on Plaquenil as prescribed.  She is vies notify us if she develops signs or symptoms of a flare.  She will follow up in 5 months or sooner if needed.  - Plan: CBC with Differential/Platelet, COMPLETE METABOLIC PANEL WITH GFR, Protein / creatinine ratio, urine, Anti-DNA antibody, double-stranded, C3 and C4, Sedimentation rate  High risk medication use - Plaquenil 200 mg 1 tablet by mouth twice daily started in September 2020.  Plan to hold off on adding imuran at this time.  PLQ eye exam: 08/01/2022 [facility] Follow up in 1 year.  Patient is scheduled to see him in eye care today and tomorrow.  She was given eye examination form to take with her to her appointment. CBC and CMP updated on 05/05/23.  Orders for CBC  and CMP were released today.  - Plan: CBC with Differential/Platelet, COMPLETE METABOLIC PANEL WITH GFR  Rosacea: Confirmed  by biopsy on 07/07/23. Under the care of dermatology.  Diffuse facial erythema consistent with rosacea noted today. She will be initiating ivermectin ointment x3 months.  If no response plan to switch to an oral agent.   Fatty liver - Right upper quadrant abdominal ultrasound performed on 04/02/2019.  LFTs within normal limits on 11/14/2022. LFTs WNL on 05/05/23. CMP updated today.   Vitamin D deficiency: She is taking vitamin D 2000 units daily.   Chronic SI joint pain: Not currently symptomatic.   Other medical conditions are listed as follows:   Eczema of both hands: Not currently symptomatic.   Other fatigue: She has noticed some increased fatigue in the evenings which she attributes to daylight savings time change.  Overall she has been sleeping well at night. Discussed the importance of good sleep hygiene.   Anxiety and depression  Lichen planus  History of hypothyroidism  Hx of migraines  Orders: Orders Placed This Encounter  Procedures   CBC with Differential/Platelet   COMPLETE METABOLIC PANEL WITH GFR   Protein / creatinine ratio, urine   Anti-DNA antibody, double-stranded   C3 and C4   Sedimentation rate   No orders of the defined types were placed in this encounter.   Follow-Up Instructions: Return in about 5 months (around 01/05/2024) for Autoimmune Disease.   Gearldine Bienenstock, PA-C  Note - This record has been created using Dragon software.  Chart creation errors have been sought, but may not always  have been located. Such creation errors do not reflect on  the standard of medical care.

## 2023-08-03 ENCOUNTER — Other Ambulatory Visit: Payer: Self-pay | Admitting: Physician Assistant

## 2023-08-03 DIAGNOSIS — R768 Other specified abnormal immunological findings in serum: Secondary | ICD-10-CM

## 2023-08-03 NOTE — Telephone Encounter (Signed)
Last Fill: 05/11/2023  Eye exam: 08/01/2022 WNL    Labs: 05/05/2023 RBC 5.27, Hgb 15.6, Hct 45.5, Creat. 0.97  Next Visit: 08/07/2023  Last Visit: 05/05/2023  DX:Autoimmune disease   Current Dose per office note 05/05/2023: Plaquenil 200 mg 1 tablet by mouth twice daily   Okay to refill Plaquenil?

## 2023-08-07 ENCOUNTER — Encounter: Payer: Self-pay | Admitting: Physician Assistant

## 2023-08-07 ENCOUNTER — Ambulatory Visit: Payer: Federal, State, Local not specified - PPO | Attending: Physician Assistant | Admitting: Physician Assistant

## 2023-08-07 VITALS — BP 126/86 | HR 76 | Resp 17 | Ht 65.0 in | Wt 221.8 lb

## 2023-08-07 DIAGNOSIS — K760 Fatty (change of) liver, not elsewhere classified: Secondary | ICD-10-CM | POA: Diagnosis not present

## 2023-08-07 DIAGNOSIS — F32A Depression, unspecified: Secondary | ICD-10-CM

## 2023-08-07 DIAGNOSIS — M359 Systemic involvement of connective tissue, unspecified: Secondary | ICD-10-CM | POA: Diagnosis not present

## 2023-08-07 DIAGNOSIS — F419 Anxiety disorder, unspecified: Secondary | ICD-10-CM

## 2023-08-07 DIAGNOSIS — G8929 Other chronic pain: Secondary | ICD-10-CM

## 2023-08-07 DIAGNOSIS — L719 Rosacea, unspecified: Secondary | ICD-10-CM | POA: Diagnosis not present

## 2023-08-07 DIAGNOSIS — L309 Dermatitis, unspecified: Secondary | ICD-10-CM

## 2023-08-07 DIAGNOSIS — Z8669 Personal history of other diseases of the nervous system and sense organs: Secondary | ICD-10-CM

## 2023-08-07 DIAGNOSIS — M533 Sacrococcygeal disorders, not elsewhere classified: Secondary | ICD-10-CM

## 2023-08-07 DIAGNOSIS — Z79899 Other long term (current) drug therapy: Secondary | ICD-10-CM

## 2023-08-07 DIAGNOSIS — R5383 Other fatigue: Secondary | ICD-10-CM

## 2023-08-07 DIAGNOSIS — E559 Vitamin D deficiency, unspecified: Secondary | ICD-10-CM

## 2023-08-07 DIAGNOSIS — Z8639 Personal history of other endocrine, nutritional and metabolic disease: Secondary | ICD-10-CM

## 2023-08-07 DIAGNOSIS — L439 Lichen planus, unspecified: Secondary | ICD-10-CM

## 2023-08-08 LAB — COMPLETE METABOLIC PANEL WITH GFR
AG Ratio: 1.2 (calc) (ref 1.0–2.5)
ALT: 13 U/L (ref 6–29)
AST: 23 U/L (ref 10–30)
Albumin: 4.4 g/dL (ref 3.6–5.1)
Alkaline phosphatase (APISO): 40 U/L (ref 31–125)
BUN: 8 mg/dL (ref 7–25)
CO2: 25 mmol/L (ref 20–32)
Calcium: 9.5 mg/dL (ref 8.6–10.2)
Chloride: 103 mmol/L (ref 98–110)
Creat: 0.68 mg/dL (ref 0.50–0.96)
Globulin: 3.6 g/dL (ref 1.9–3.7)
Glucose, Bld: 86 mg/dL (ref 65–99)
Potassium: 4.2 mmol/L (ref 3.5–5.3)
Sodium: 136 mmol/L (ref 135–146)
Total Bilirubin: 0.5 mg/dL (ref 0.2–1.2)
Total Protein: 8 g/dL (ref 6.1–8.1)
eGFR: 122 mL/min/{1.73_m2} (ref >=60)

## 2023-08-08 LAB — ANTI-DNA ANTIBODY, DOUBLE-STRANDED: ds DNA Ab: 33 [IU]/mL — ABNORMAL HIGH

## 2023-08-08 LAB — CBC WITH DIFFERENTIAL/PLATELET
Absolute Lymphocytes: 1590 {cells}/uL (ref 850–3900)
Absolute Monocytes: 462 {cells}/uL (ref 200–950)
Basophils Absolute: 40 {cells}/uL (ref 0–200)
Basophils Relative: 0.7 %
Eosinophils Absolute: 103 {cells}/uL (ref 15–500)
Eosinophils Relative: 1.8 %
HCT: 46.3 % — ABNORMAL HIGH (ref 35.0–45.0)
Hemoglobin: 15.5 g/dL (ref 11.7–15.5)
MCH: 29.3 pg (ref 27.0–33.0)
MCHC: 33.5 g/dL (ref 32.0–36.0)
MCV: 87.5 fL (ref 80.0–100.0)
MPV: 10 fL (ref 7.5–12.5)
Monocytes Relative: 8.1 %
Neutro Abs: 3506 {cells}/uL (ref 1500–7800)
Neutrophils Relative %: 61.5 %
Platelets: 244 10*3/uL (ref 140–400)
RBC: 5.29 10*6/uL — ABNORMAL HIGH (ref 3.80–5.10)
RDW: 12.1 % (ref 11.0–15.0)
Total Lymphocyte: 27.9 %
WBC: 5.7 10*3/uL (ref 3.8–10.8)

## 2023-08-08 LAB — PROTEIN / CREATININE RATIO, URINE
Creatinine, Urine: 34 mg/dL (ref 20–275)
Total Protein, Urine: <4 mg/dL — ABNORMAL LOW (ref 5–24)

## 2023-08-08 LAB — SEDIMENTATION RATE: Sed Rate: 14 mm/h (ref 0–20)

## 2023-08-08 LAB — C3 AND C4
C3 Complement: 169 mg/dL (ref 83–193)
C4 Complement: 28 mg/dL (ref 15–57)

## 2023-08-08 NOTE — Progress Notes (Signed)
CBC stable.  No proteinuria.  CMP WNL  ESR WNL  Complements WNL

## 2023-08-09 NOTE — Progress Notes (Signed)
dsDNA remains positive but continues to trend down.  Labs are not consistent from a flare.

## 2023-10-13 DIAGNOSIS — M359 Systemic involvement of connective tissue, unspecified: Secondary | ICD-10-CM | POA: Diagnosis not present

## 2023-10-13 DIAGNOSIS — E063 Autoimmune thyroiditis: Secondary | ICD-10-CM | POA: Diagnosis not present

## 2023-10-13 DIAGNOSIS — L719 Rosacea, unspecified: Secondary | ICD-10-CM | POA: Diagnosis not present

## 2023-10-13 DIAGNOSIS — Z1330 Encounter for screening examination for mental health and behavioral disorders, unspecified: Secondary | ICD-10-CM | POA: Diagnosis not present

## 2023-10-13 DIAGNOSIS — Z0000 Encounter for general adult medical examination without abnormal findings: Secondary | ICD-10-CM | POA: Diagnosis not present

## 2023-11-08 ENCOUNTER — Other Ambulatory Visit: Payer: Self-pay | Admitting: Rheumatology

## 2023-11-08 DIAGNOSIS — R768 Other specified abnormal immunological findings in serum: Secondary | ICD-10-CM

## 2023-11-08 NOTE — Telephone Encounter (Signed)
 Last Fill: 08/03/2023  Eye exam: 08/07/2023 WNL    Labs: 08/07/2023 CBC stable. No proteinuria. CMP WNL ESR WNL Complements WNL  Next Visit: 01/05/2024  Last Visit: 08/07/2023  DX:Autoimmune disease   Current Dose per office note 08/07/2023: Plaquenil 200 mg 1 tablet by mouth twice daily   Okay to refill Plaquenil?

## 2023-11-27 ENCOUNTER — Ambulatory Visit (INDEPENDENT_AMBULATORY_CARE_PROVIDER_SITE_OTHER): Payer: Medicaid Other | Admitting: Internal Medicine

## 2023-11-27 ENCOUNTER — Encounter: Payer: Self-pay | Admitting: Internal Medicine

## 2023-11-27 VITALS — BP 120/76 | HR 77 | Ht 65.0 in | Wt 236.2 lb

## 2023-11-27 DIAGNOSIS — E063 Autoimmune thyroiditis: Secondary | ICD-10-CM | POA: Diagnosis not present

## 2023-11-27 MED ORDER — LEVOTHYROXINE SODIUM 50 MCG PO TABS: 75.0000 ug | ORAL_TABLET | Freq: Every day | ORAL | 3 refills | Status: AC

## 2023-11-27 NOTE — Progress Notes (Signed)
 Patient ID: Ariana Scott, female   DOB: 1996/08/05, 28 y.o.   MRN: [id number]   HPI  Ariana Scott is a 28 y.o.-year-old female, initially referred by Dr. Loreta Ave, returning for follow-up for Hashimoto's hypothyroidism.  Last visit 1 year ago. She now lives near [place].  Interim hx: No headaches, increased fatigue, weight gain, cold intolerance, constipation. She has severe rosacea as diagnosed by dermatology (on face including lesions on chest and inside her mouth!) - on metronidazole gel on face. She feels this is helping. She had shingles since last visit. She also had dry eyes and mouth (Sjogren syndrome). She lost her job at the [employer] near [place] last month.  She mentions that this was her dream job.  She is hoping to get it back.  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  She now lives with her grandmother near [place] in an older house, which has mold.    Reviewed history: Pt. was diagnosed with hypothyroidism in 11/2018 during investigation for IBS by Dr. Loreta Ave.    Patient has had constipation for years, but she then started to have loose stools (for 4 years) and was sent to GI.  Dr. Loreta Ave checked her TSH which returned very high, at 42.35.   She was started on levothyroxine 75 mcg daily and referred to endocrinology.  In the past, she was taking 52-59 mcg levothyroxine daily, but was having palpitations so we switched to 50 mcg tablets, which are white.  She tolerates this well.  Pt is on levothyroxine 50 + 25 mcg daily, taken: - in am - fasting - at least 30 min-1h from b'fast - no calcium - no iron - no multivitamins - no PPIs - not on Biotin On vitamin D 2000 units daily.  I reviewed patient's TFTs: 10/13/2023: TSH 1.750, free T41.41 Lab Results  Component Value Date   TSH 1.22 11/22/2022   TSH 2.41 10/15/2021   TSH 1.50 10/06/2020   TSH 2.35 03/24/2020   TSH 2.58 11/11/2019   TSH 4.19 09/16/2019   TSH 6.97 (H) 07/23/2019   TSH  5.72 (H) 06/18/2019   TSH 85.26 (H) 04/01/2019   TSH 1.38 01/24/2019   FREET4 0.95 11/22/2022   FREET4 0.82 10/15/2021   FREET4 0.90 10/06/2020   FREET4 0.90 03/24/2020   FREET4 0.91 11/11/2019   FREET4 0.91 09/16/2019   FREET4 0.82 07/23/2019   FREET4 0.82 06/18/2019   FREET4 0.46 (L) 04/01/2019   FREET4 0.97 01/24/2019  12/11/2018: TSH 42.35  Her antithyroid antibodies are high, pointing towards a diagnosis of Hashimoto's thyroiditis Component     Latest Ref Rng & Units 01/24/2019  Thyroperoxidase Ab SerPl-aCnc     <9 IU/mL 395 (H)  Thyroglobulin Ab     < or = 1 IU/mL 2 (H)   She previously had palpitations, which improved after changing levothyroxine tablets to white ones (50 mcg).  Pt denies: - feeling nodules in neck - hoarseness - dysphagia - choking  She has + FH of thyroid disorders in: PGGF. No FH of thyroid cancer. No h/o radiation tx to head or neck. No herbal supplements. No Biotin use. No recent steroids use.   She also has a history of trochanteric bursitis. She had migraines with visual aura -previously on OCPs for years for her amenorrhea/irregular menses. She stopped OCPs afterwards. No more migraines.  Also, her menstrual cycles are now almost normal. She was also found to have elevated LFTs at investigation by Dr. Loreta Ave.  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  She had a liver ultrasound >> fatty liver.   We checked an HbA1c per her request due to increased thirst.  This was normal: Lab Results  Component Value Date   HGBA1C 5.1 10/15/2021   ROS: + see HPI  I reviewed pt's medications, allergies, PMH, social hx, family hx, and changes were documented in the history of present illness. Otherwise, unchanged from my initial visit note.  Past Medical History:  Diagnosis Date   Autoimmune disease (HCC)    Eczema    Erosive lichen planus of vulva    Hashimoto's disease    History of rosacea    Hypothyroidism     IBS (irritable bowel syndrome)    Migraines    Polycystic ovarian syndrome    Past Surgical History:  Procedure Laterality Date   COLONOSCOPY  2020   Dr. Loreta Ave   eyelid surgery Right 03/26/2021   upper eyelid surgery   SKIN BIOPSY  06/2023   on face, [facility]   UPPER GASTROINTESTINAL ENDOSCOPY  2020   Dr. Loreta Ave   WISDOM TOOTH EXTRACTION  2016   Social History   Socioeconomic History   Marital status: Single    Spouse name: Not on file   Number of children: 0   Years of education: Not on file   Highest education level: Not on file  Occupational History    Recent college graduate, unemployed  Social Needs   Financial resource strain: Not on file   Food insecurity:    Worry: Not on file    Inability: Not on file   Transportation needs:    Medical: Not on file    Non-medical: Not on file  Tobacco Use   Smoking status: Never Smoker   Smokeless tobacco: Never Used  Substance and Sexual Activity   Alcohol use:  1 glass of wine,   Drug use: No   Current Outpatient Medications on File Prior to Visit  Medication Sig Dispense Refill   azelastine (ASTELIN) 0.1 % nasal spray two sprays by Both Nostrils route 2 (two) times daily. (Patient not taking: Reported on 05/05/2023)     cholecalciferol (VITAMIN D3) 25 MCG (1000 UNIT) tablet Take 2,000 Units by mouth every other day. (Patient not taking: Reported on 08/07/2023)     Cholecalciferol 50 MCG (2000 UT) CAPS      clobetasol ointment (TEMOVATE) 0.05 % daily as needed.     hydroxychloroquine (PLAQUENIL) 200 MG tablet TAKE 1 TABLET BY MOUTH TWICE A DAY 60 tablet 2   levothyroxine (SYNTHROID) 50 MCG tablet TAKE 1.5 TABLETS (75 MCG TOTAL) BY MOUTH DAILY BEFORE BREAKFAST. 45 tablet 8   predniSONE (DELTASONE) 5 MG tablet Take 4 tabs po qd x 7 days, 3  tabs po qd x 7 days, 2  tabs po qd x 7 days, 1  tab po qd x 7 days (Patient not taking: Reported on 08/07/2023) 70 tablet 0   No current facility-administered  medications on file prior to visit.   Allergies  Allergen Reactions   Clindamycin Anaphylaxis, Itching, Rash and Swelling   Doxycycline Anaphylaxis, Itching, Rash and Swelling   Cefuroxime     Cardiac symptoms    Kiwi Extract Itching   Pineapple Itching   Family history: -Mother, aunt with diabetes -Father with HTN and HL -Cancer in paternal grandmother: Breast and uterus + See HPI  PE: BP 120/76   Pulse 77  Ht 5\' 5"  (1.651 m)   Wt 236 lb 3.2 oz (107.1 kg)   SpO2 97%   BMI 39.31 kg/m  Wt Readings from Last 10 Encounters:  11/27/23 236 lb 3.2 oz (107.1 kg)  08/07/23 221 lb 12.8 oz (100.6 kg)  05/05/23 206 lb 9.6 oz (93.7 kg)  12/01/22 224 lb (101.6 kg)  11/22/22 218 lb (98.9 kg)  08/26/22 214 lb (97.1 kg)  02/25/22 216 lb 9.6 oz (98.2 kg)  11/18/21 210 lb 8 oz (95.5 kg)  11/16/21 210 lb (95.3 kg)  11/05/21 211 lb 3.2 oz (95.8 kg)   Constitutional: overweight, in NAD Eyes:  EOMI, no exophthalmos ENT: no neck masses, no cervical lymphadenopathy Cardiovascular: RRR, No MRG Respiratory: CTA B Musculoskeletal: no deformities Skin:no rashes Neurological: no tremor with outstretched hands  ASSESSMENT: 1. Hypothyroidism -Due to Hashimoto's thyroiditis  2.  History of elevated calcium  PLAN:  1. Patient with history of autoimmune hypothyroidism, on levothyroxine.  She initially had symptoms consistent with hypothyroidism including weight gain, dry skin, change in bowel habits, but also anxiety, tachycardia, tremors.  She felt better after starting levothyroxine, with less fatigue.  She initially had headaches but these resolved after stopping OCPs and wearing sunglasses.  - latest thyroid labs reviewed with pt. >> normal in 09/2023 per review of outside records - she continues on LT4 75 mcg daily - pt feels good on this dose.  She gained 7 pounds before last visit, and gained 18 more pounds since then - we discussed about taking the thyroid hormone every day, with  water, >30 minutes before breakfast, separated by >4 hours from acid reflux medications, calcium, iron, multivitamins. Pt. is taking it correctly. -At today's visit, I refilled her levothyroxine - OTW, I will see her back in a year -we discussed that we could do this virtually if she still lives 3 hours away.  However, the closest lab covered by her insurance is 2 hours away and she prefers to drive here instead.  2.  History of elevated calcium -She had a total calcium of 10.3, slightly above the upper limit of the target range (8.6-10.2), however, the most recent calcium level was normal: Lab Results  Component Value Date   CALCIUM 9.5 08/07/2023  -The previous high calcium was likely due to her higher albumin, at 4.8.  I reassured her that the corrected calcium was actually normal.  The high albumin could have been related to dehydration. -No further follow-up needed  Requested Prescriptions   Signed Prescriptions Disp Refills   levothyroxine (SYNTHROID) 50 MCG tablet 135 tablet 3    Sig: Take 1.5 tablets (75 mcg total) by mouth daily before breakfast.    Carlus Pavlov, MD PhD [facility] Endocrinology

## 2023-11-27 NOTE — Patient Instructions (Addendum)
Please continue: - Levothyroxine 75 mcg every day  Take the thyroid hormone every day, with water, at least 30 minutes before breakfast, separated by at least 4 hours from: - acid reflux medications - calcium - iron - multivitamins  Please come back for a follow-up appointment in 1 year.

## 2023-12-22 NOTE — Progress Notes (Deleted)
 Office Visit Note  Patient: Ariana Scott             Date of Birth: December 11, 1995           MRN: [id number]             PCP: Tracey Harries, MD Referring: Tracey Harries, MD Visit Date: 01/05/2024 Occupation: @GUAROCC @  Subjective:  No chief complaint on file.   History of Present Illness: Ariana Scott is a 28 y.o. female ***     Activities of Daily Living:  Patient reports morning stiffness for *** {minute/hour:19697}.   Patient {ACTIONS;DENIES/REPORTS:21021675::"Denies"} nocturnal pain.  Difficulty dressing/grooming: {ACTIONS;DENIES/REPORTS:21021675::"Denies"} Difficulty climbing stairs: {ACTIONS;DENIES/REPORTS:21021675::"Denies"} Difficulty getting out of chair: {ACTIONS;DENIES/REPORTS:21021675::"Denies"} Difficulty using hands for taps, buttons, cutlery, and/or writing: {ACTIONS;DENIES/REPORTS:21021675::"Denies"}  No Rheumatology ROS completed.   PMFS History:  Patient Active Problem List   Diagnosis Date Noted   Abnormal MRI 11/18/2021   Vision changes 11/18/2021   Chronic migraine w/o aura w/o status migrainosus, not intractable 11/18/2021   Hypothyroidism due to Hashimoto's thyroiditis 03/24/2020   Hair loss 08/13/2019   Chronic midline low back pain without sciatica 07/08/2019   Elevated LFTs 07/08/2019   Fatty liver 07/08/2019   Autoimmune disease (HCC) 07/08/2019   Anxiety 05/01/2019   Lichen planus 05/01/2019   Other acne 05/01/2019   Hypothyroidism (acquired) 01/24/2019   Dry skin 08/14/2018   Erosive lichen planus of vulva 09/13/2015   Diarrhea 09/12/2014   Cervical sprain 09/11/2013    Past Medical History:  Diagnosis Date   Autoimmune disease (HCC)    Eczema    Erosive lichen planus of vulva    Hashimoto's disease    History of rosacea    Hypothyroidism    IBS (irritable bowel syndrome)    Migraines    Polycystic ovarian syndrome     Family History  Problem Relation Age of Onset   Diabetes Mother    Depression Mother    ADD / ADHD  Mother    Depression Father    Vitamin D deficiency Father    Colitis Father    Hypertension Father    High Cholesterol Father    ADD / ADHD Brother    Past Surgical History:  Procedure Laterality Date   COLONOSCOPY  2020   Dr. Loreta Ave   eyelid surgery Right 03/26/2021   upper eyelid surgery   SKIN BIOPSY  06/2023   on face, [facility]   UPPER GASTROINTESTINAL ENDOSCOPY  2020   Dr. Loreta Ave   WISDOM TOOTH EXTRACTION  2016   Social History   Social History Narrative   Not on file   Immunization History  Administered Date(s) Administered   DTaP 10/16/1995, 11/29/1995, 01/11/1996, 01/10/1997, 04/28/2000   HIB (PRP-OMP) 10/16/1995, 11/29/1995, 01/11/1996, 01/10/1997   HPV Quadrivalent 05/21/2007, 07/03/2008   Hepatitis A 05/21/2007, 07/03/2008   Hepatitis B 07/31/1995, 08/01/1995, 01/11/1996   IPV 10/16/1995, 11/29/1995, 01/11/1996, 04/28/2000   MMR 07/02/1996, 04/28/2000   Meningococcal Conjugate 05/21/2007   PFIZER(Purple Top)SARS-COV-2 Vaccination 12/07/2019, 12/31/2019, 08/18/2020   Pfizer Covid-19 Vaccine Bivalent Booster 27yrs & up 06/08/2021   Pfizer(Comirnaty)Fall Seasonal Vaccine 12 years and older 08/17/2022, 06/10/2023   Tdap 04/23/2007     Objective: Vital Signs: There were no vitals taken for this visit.   Physical Exam   Musculoskeletal Exam: ***  CDAI Exam: CDAI Score: -- Patient Global: --; Provider Global: -- Swollen: --; Tender: -- Joint Exam 01/05/2024   No joint exam has been documented for this visit   There  is currently no information documented on the homunculus. Go to the Rheumatology activity and complete the homunculus joint exam.  Investigation: No additional findings.  Imaging: No results found.  Recent Labs: Lab Results  Component Value Date   WBC 5.7 08/07/2023   HGB 15.5 08/07/2023   PLT 244 08/07/2023   NA 136 08/07/2023   K 4.2 08/07/2023   CL 103 08/07/2023   CO2 25 08/07/2023   GLUCOSE 86 08/07/2023    BUN 8 08/07/2023   CREATININE 0.68 08/07/2023   BILITOT 0.5 08/07/2023   AST 23 08/07/2023   ALT 13 08/07/2023   PROT 8.0 08/07/2023   CALCIUM 9.5 08/07/2023   GFRAA 133 03/01/2021   QFTBGOLDPLUS NEGATIVE 05/05/2023    Speciality Comments: PLQ eye exam: 08/07/2023 [facility] Follow up in 1 year.                              Procedures:  No procedures performed Allergies: Clindamycin, Doxycycline, Cefuroxime, Kiwi extract, and Pineapple   Assessment / Plan:     Visit Diagnoses: No diagnosis found.  Orders: No orders of the defined types were placed in this encounter.  No orders of the defined types were placed in this encounter.   Face-to-face time spent with patient was *** minutes. Greater than 50% of time was spent in counseling and coordination of care.  Follow-Up Instructions: No follow-ups on file.   Ellen Henri, CMA  Note - This record has been created using Animal nutritionist.  Chart creation errors have been sought, but may not always  have been located. Such creation errors do not reflect on  the standard of medical care.

## 2023-12-26 DIAGNOSIS — L719 Rosacea, unspecified: Secondary | ICD-10-CM | POA: Diagnosis not present

## 2024-01-05 ENCOUNTER — Ambulatory Visit: Payer: Federal, State, Local not specified - PPO | Admitting: Physician Assistant

## 2024-01-05 DIAGNOSIS — M359 Systemic involvement of connective tissue, unspecified: Secondary | ICD-10-CM

## 2024-01-05 DIAGNOSIS — R5383 Other fatigue: Secondary | ICD-10-CM

## 2024-01-05 DIAGNOSIS — E559 Vitamin D deficiency, unspecified: Secondary | ICD-10-CM

## 2024-01-05 DIAGNOSIS — L719 Rosacea, unspecified: Secondary | ICD-10-CM

## 2024-01-05 DIAGNOSIS — G8929 Other chronic pain: Secondary | ICD-10-CM

## 2024-01-05 DIAGNOSIS — F32A Depression, unspecified: Secondary | ICD-10-CM

## 2024-01-05 DIAGNOSIS — Z8639 Personal history of other endocrine, nutritional and metabolic disease: Secondary | ICD-10-CM

## 2024-01-05 DIAGNOSIS — L439 Lichen planus, unspecified: Secondary | ICD-10-CM

## 2024-01-05 DIAGNOSIS — K760 Fatty (change of) liver, not elsewhere classified: Secondary | ICD-10-CM

## 2024-01-05 DIAGNOSIS — L309 Dermatitis, unspecified: Secondary | ICD-10-CM

## 2024-01-05 DIAGNOSIS — Z79899 Other long term (current) drug therapy: Secondary | ICD-10-CM

## 2024-01-05 DIAGNOSIS — Z8669 Personal history of other diseases of the nervous system and sense organs: Secondary | ICD-10-CM

## 2024-01-19 NOTE — Progress Notes (Signed)
 Office Visit Note  Patient: Ariana Scott             Date of Birth: 1996/09/07           MRN: [id number]             PCP: Alfredia Ina, MD Referring: Alfredia Ina, MD Visit Date: 02/02/2024 Occupation: @GUAROCC @  Subjective:  Photosensitivity  History of Present Illness: Ariana Scott is a 28 y.o. female with history of autoimmune disease.  Patient remains on Plaquenil  200 mg 1 tablet by mouth twice daily started in September 2020.  She is tolerating Plaquenil  without any side effects and has not had any recent gaps in therapy.  Patient reports that her rosacea has been significantly better controlled since initiating metronidazole gel twice daily under the care of dermatology.  Patient states that she has noticed increased photosensitivity despite wearing sunscreen either 50 or 100 SPF daily.  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  Patient states that by 6 PM if she has noticed increased photosensitivity she will experience fatigue, migraines, and nausea.  She has not had any recent photosensitivity rashes but states that she has systemic side effects with sun exposure.  Patient has been trying to remain hydrated during the day and has also incorporated some electrolytes. She denies any oral or nasal ulcerations.  She has not had any increase sicca symptoms.  She denies any symptoms of Raynaud's phenomenon.  She denies any increased joint pain or joint swelling.  She continues to take vitamin D  2000 units daily.   Activities of Daily Living:  Patient reports morning stiffness for 30 minutes.   Patient Denies nocturnal pain.  Difficulty dressing/grooming: Denies Difficulty climbing stairs: Denies Difficulty getting out of chair: Denies Difficulty using hands for taps, buttons, cutlery, and/or writing: Denies  Review of Systems  Constitutional:  Positive for fatigue.  HENT:  Negative for mouth sores and mouth dryness.    Eyes:  Negative for dryness.  Respiratory:  Negative for shortness of breath.   Cardiovascular:  Negative for chest pain and palpitations.  Gastrointestinal:  Negative for blood in stool, constipation and diarrhea.  Endocrine: Negative for increased urination.  Genitourinary:  Negative for involuntary urination.  Musculoskeletal:  Positive for joint pain, joint pain, myalgias, muscle weakness, morning stiffness and myalgias. Negative for gait problem, joint swelling and muscle tenderness.  Skin:  Positive for sensitivity to sunlight. Negative for color change, rash and hair loss.  Allergic/Immunologic: Positive for susceptible to infections.  Neurological:  Positive for dizziness and headaches.  Hematological:  Negative for swollen glands.  Psychiatric/Behavioral:  Negative for depressed mood and sleep disturbance. The patient is not nervous/anxious.     PMFS History:  Patient Active Problem List   Diagnosis Date Noted   Abnormal MRI 11/18/2021   Vision changes 11/18/2021   Chronic migraine w/o aura w/o status migrainosus, not intractable 11/18/2021   Hypothyroidism due to Hashimoto's thyroiditis 03/24/2020   Hair loss 08/13/2019   Chronic midline low back pain without sciatica 07/08/2019   Elevated LFTs 07/08/2019   Fatty liver 07/08/2019   Autoimmune disease (HCC) 07/08/2019   Anxiety 05/01/2019   Lichen planus 05/01/2019   Other acne 05/01/2019   Hypothyroidism (acquired) 01/24/2019   Dry skin 08/14/2018   Erosive lichen planus of vulva 09/13/2015   Diarrhea 09/12/2014   Cervical sprain 09/11/2013    Past Medical History:  Diagnosis Date   Autoimmune  disease (HCC)    Eczema    Erosive lichen planus of vulva    Hashimoto's disease    History of rosacea    Hypothyroidism    IBS (irritable bowel syndrome)    Migraines    Polycystic ovarian syndrome     Family History  Problem Relation Age of Onset   Diabetes Mother    Depression Mother    ADD / ADHD Mother     Depression Father    Vitamin D  deficiency Father    Colitis Father    Hypertension Father    High Cholesterol Father    ADD / ADHD Brother    Past Surgical History:  Procedure Laterality Date   COLONOSCOPY  2020   Dr. Tova Fresh   eyelid surgery Right 03/26/2021   upper eyelid surgery   SKIN BIOPSY  06/2023   on face, [facility]   UPPER GASTROINTESTINAL ENDOSCOPY  2020   Dr. Tova Fresh   WISDOM TOOTH EXTRACTION  2016   Social History   Social History Narrative   Not on file   Immunization History  Administered Date(s) Administered   DTaP 10/16/1995, 11/29/1995, 01/11/1996, 01/10/1997, 04/28/2000   HIB (PRP-OMP) 10/16/1995, 11/29/1995, 01/11/1996, 01/10/1997   HPV Quadrivalent 05/21/2007, 07/03/2008   Hepatitis A 05/21/2007, 07/03/2008   Hepatitis B 1995/05/12, 08/01/1995, 01/11/1996   IPV 10/16/1995, 11/29/1995, 01/11/1996, 04/28/2000   MMR 07/02/1996, 04/28/2000   Meningococcal Conjugate 05/21/2007   PFIZER(Purple Top)SARS-COV-2 Vaccination 12/07/2019, 12/31/2019, 08/18/2020   Pfizer Covid-19 Vaccine Bivalent Booster 66yrs & up 06/08/2021   Pfizer(Comirnaty)Fall Seasonal Vaccine 12 years and older 08/17/2022, 06/10/2023   Tdap 04/23/2007     Objective: Vital Signs: BP (!) 140/91 (BP Location: Left Arm, Patient Position: Sitting, Cuff Size: Normal)   Pulse 76   Resp 15   Ht 5' 5.5" (1.664 m)   Wt 256 lb 4.8 oz (116.3 kg)   LMP 02/01/2024   BMI 42.00 kg/m    Physical Exam Vitals and nursing note reviewed.  Constitutional:      Appearance: She is well-developed.  HENT:     Head: Normocephalic and atraumatic.  Eyes:     Conjunctiva/sclera: Conjunctivae normal.  Cardiovascular:     Rate and Rhythm: Normal rate and regular rhythm.     Heart sounds: Normal heart sounds.  Pulmonary:     Effort: Pulmonary effort is normal.     Breath sounds: Normal breath sounds.  Abdominal:     General: Bowel sounds are normal.     Palpations: Abdomen is soft.   Musculoskeletal:     Cervical back: Normal range of motion.  Skin:    General: Skin is warm and dry.     Capillary Refill: Capillary refill takes less than 2 seconds. No nailbed changes.  No signs of sclerodactyly.    Comments: Faint facial erythema diffusely secondary to rosacea.  No active pustules noted.   Neurological:     Mental Status: She is alert and oriented to person, place, and time.  Psychiatric:        Behavior: Behavior normal.      Musculoskeletal Exam: C-spine, thoracic spine, lumbar spine and good range of motion.  Shoulder joints, elbow joints, wrist joints, MCPs, PIPs, DIPs a good range of motion with no synovitis.  Complete fist formation bilaterally.  Hip joints have good range of motion with no groin pain.  Knee joints have good range of motion with no warmth or effusion.  Ankle joints have good range of motion with  no tenderness or joint swelling.  CDAI Exam: CDAI Score: -- Patient Global: --; Provider Global: -- Swollen: --; Tender: -- Joint Exam 02/02/2024   No joint exam has been documented for this visit   There is currently no information documented on the homunculus. Go to the Rheumatology activity and complete the homunculus joint exam.  Investigation: No additional findings.  Imaging: No results found.  Recent Labs: Lab Results  Component Value Date   WBC 5.7 08/07/2023   HGB 15.5 08/07/2023   PLT 244 08/07/2023   NA 136 08/07/2023   K 4.2 08/07/2023   CL 103 08/07/2023   CO2 25 08/07/2023   GLUCOSE 86 08/07/2023   BUN 8 08/07/2023   CREATININE 0.68 08/07/2023   BILITOT 0.5 08/07/2023   AST 23 08/07/2023   ALT 13 08/07/2023   PROT 8.0 08/07/2023   CALCIUM 9.5 08/07/2023   GFRAA 133 03/01/2021   QFTBGOLDPLUS NEGATIVE 05/05/2023    Speciality Comments: PLQ eye exam: 08/07/2023 WNL [facility] Follow up in 1 year.                              Procedures:  No procedures performed Allergies: Clindamycin, Doxycycline, Cefuroxime,  Kiwi extract, and Pineapple    Assessment / Plan:     Visit Diagnoses: Autoimmune disease (HCC) - Positive ANA, double-stranded DNA, positive Ro, history of photosensitivity, facial rash and elevated LFTs: She has not had any signs or symptoms of a flare.  She has clinically been doing well taking Plaquenil  200 mg 1 tablet by mouth twice daily.  She is tolerating Plaquenil  without any new side effects.  She has not had any recent gaps in therapy.  She has no synovitis on examination today.  She has not had any symptoms of Raynaud's phenomenon, oral or nasal ulcerations, or sicca symptoms.  She continues to have chronic fatigue which has been stable overall.  She has noticed increased photosensitivity during spring 2025.  She has been wearing sunscreen SPF 50 or 100 on her face as well as sunglasses and a hat while outdoors.  She performs field work during the mornings but by 6 PM she often times will feel fatigued, lightheaded, nauseous, and having migraine after sun exposure or inadequate oral hydration.  Discussed the importance of maintaining adequate oral hydration with electrolytes during the summer months as well as reapplying sunscreen every 2 hours while outdoors.  Also discussed the option of UV protective clothing with either detergent with SPF or clothing with UV protection.  Discussed that Plaquenil  can also increase photosensitivity. Lab work from 08/07/23 was reviewed today in the office: dsDNA 33, complements WNL, and ESR WNL.  Plan to update the following lab work today. She will remain on Plaquenil  as prescribed.  A refill of Plaquenil  sent to the pharmacy today.  She was advised to notify us  if she develops signs or symptoms of a flare.  She will follow-up in the office in 5 months or sooner if needed. - Plan: CBC with Differential/Platelet, C3 and C4, Sedimentation rate, Comprehensive metabolic panel with GFR, Protein / creatinine ratio, urine, Anti-DNA antibody, double-stranded, CANCELED:  Comprehensive metabolic panel with GFR  High risk medication use - Plaquenil  200 mg 1 tablet by mouth twice daily started in September 2020.  PLQ eye exam: 08/07/2023 WNL [facility] Follow up in 1 year.  CBC and CMP 08/07/23.  Orders for CBC and CMP were released today. -  Plan: CBC with Differential/Platelet, CANCELED: Comprehensive metabolic panel with GFR  Rosacea - Confirmed by biopsy on 07/07/23. Under the care of dermatology.  Significantly improved since initiating metronidazole gel twice daily.  She has faint diffuse facial erythema but no pustules noted. She has been wearing sunscreen SPF 50 or 100 on a daily basis.  Discussed the importance of wearing a hat and sunglasses for sun protection as well.  Fatty liver - Right upper quadrant abdominal ultrasound performed on 04/02/2019.  LFTs within normal limits on 11/14/2022. LFTs WNL on 05/05/23.  Plan to update CMP today.  Vitamin D  deficiency: She is taking vitamin D  2000 units daily.  Chronic SI joint pain: She experiences occasional discomfort in her lower back which she attributes to her menstrual cycle.  She has not had any symptoms of radiculopathy.  Eczema of both hands: Not currently symptomatic.  Other fatigue: Chronic, stable.  Plan to obtain the following lab work today.  She notices increased fatigue after sun exposure.  Discussed the importance of adequate oral hydration and UV protection.  Other medical conditions are listed as follows:  Anxiety and depression  Lichen planus  History of hypothyroidism: Under the care of endocrinology.  Taking Synthroid  as prescribed.  Hx of migraines: Patient notices an increased frequency of migraines with sun exposure.  Discussed the importance of trying to avoid UV exposure by wearing a hat, UV protective clothing, SPF 50 on a daily basis--reapplying every 2 hours while outdoors, and wearing sunglasses.  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    Orders: Orders Placed This Encounter  Procedures   CBC with Differential/Platelet   C3 and C4   Sedimentation rate   Comprehensive metabolic panel with GFR   Protein / creatinine ratio, urine   Anti-DNA antibody, double-stranded   Meds ordered this encounter  Medications   hydroxychloroquine  (PLAQUENIL ) 200 MG tablet    Sig: Take 1 tablet (200 mg total) by mouth 2 (two) times daily.    Dispense:  180 tablet    Refill:  0     Follow-Up Instructions: Return in about 5 months (around 07/04/2024) for Autoimmune Disease.   Romayne Clubs, PA-C  Note - This record has been created using Dragon software.  Chart creation errors have been sought, but may not always  have been located. Such creation errors do not reflect on  the standard of medical care.

## 2024-02-02 ENCOUNTER — Ambulatory Visit: Attending: Physician Assistant | Admitting: Physician Assistant

## 2024-02-02 ENCOUNTER — Other Ambulatory Visit: Payer: Self-pay | Admitting: Rheumatology

## 2024-02-02 ENCOUNTER — Encounter: Payer: Self-pay | Admitting: Physician Assistant

## 2024-02-02 VITALS — BP 140/91 | HR 76 | Resp 15 | Ht 65.5 in | Wt 256.3 lb

## 2024-02-02 DIAGNOSIS — K760 Fatty (change of) liver, not elsewhere classified: Secondary | ICD-10-CM

## 2024-02-02 DIAGNOSIS — Z79899 Other long term (current) drug therapy: Secondary | ICD-10-CM

## 2024-02-02 DIAGNOSIS — Z8639 Personal history of other endocrine, nutritional and metabolic disease: Secondary | ICD-10-CM

## 2024-02-02 DIAGNOSIS — L719 Rosacea, unspecified: Secondary | ICD-10-CM | POA: Diagnosis not present

## 2024-02-02 DIAGNOSIS — M359 Systemic involvement of connective tissue, unspecified: Secondary | ICD-10-CM | POA: Diagnosis not present

## 2024-02-02 DIAGNOSIS — F419 Anxiety disorder, unspecified: Secondary | ICD-10-CM

## 2024-02-02 DIAGNOSIS — G8929 Other chronic pain: Secondary | ICD-10-CM

## 2024-02-02 DIAGNOSIS — L439 Lichen planus, unspecified: Secondary | ICD-10-CM

## 2024-02-02 DIAGNOSIS — R768 Other specified abnormal immunological findings in serum: Secondary | ICD-10-CM

## 2024-02-02 DIAGNOSIS — L309 Dermatitis, unspecified: Secondary | ICD-10-CM

## 2024-02-02 DIAGNOSIS — M533 Sacrococcygeal disorders, not elsewhere classified: Secondary | ICD-10-CM

## 2024-02-02 DIAGNOSIS — F32A Depression, unspecified: Secondary | ICD-10-CM

## 2024-02-02 DIAGNOSIS — R5383 Other fatigue: Secondary | ICD-10-CM

## 2024-02-02 DIAGNOSIS — E559 Vitamin D deficiency, unspecified: Secondary | ICD-10-CM

## 2024-02-02 DIAGNOSIS — Z8669 Personal history of other diseases of the nervous system and sense organs: Secondary | ICD-10-CM

## 2024-02-02 MED ORDER — HYDROXYCHLOROQUINE SULFATE 200 MG PO TABS: 200.0000 mg | ORAL_TABLET | Freq: Two times a day (BID) | ORAL | 0 refills | Status: DC

## 2024-02-02 NOTE — Telephone Encounter (Signed)
Prescription sent during visit.

## 2024-02-02 NOTE — Telephone Encounter (Signed)
 Last Fill: 11/08/2023  Eye exam: 08/07/2023 WNL   Labs: 10/13/2023 CBC ([facility] Tab) RBC 5.48 Hematocrit 47.6  08/07/2023 CMP WNL  Next Visit: 02/02/2024  Last Visit: 08/07/2023  DX: Autoimmune disease (HCC)   Current Dose per office note 08/07/2023: Plaquenil  200 mg 1 tablet by mouth twice daily   Patient to update labs at upcomming appointment, patient will be seen today.  Okay to refill Plaquenil ?

## 2024-02-03 LAB — CBC WITH DIFFERENTIAL/PLATELET
Absolute Lymphocytes: 1373 {cells}/uL (ref 850–3900)
Absolute Monocytes: 504 {cells}/uL (ref 200–950)
Basophils Absolute: 32 {cells}/uL (ref 0–200)
Basophils Relative: 0.6 %
Eosinophils Absolute: 58 {cells}/uL (ref 15–500)
Eosinophils Relative: 1.1 %
HCT: 46.1 % — ABNORMAL HIGH (ref 35.0–45.0)
Hemoglobin: 15.3 g/dL (ref 11.7–15.5)
MCH: 28.7 pg (ref 27.0–33.0)
MCHC: 33.2 g/dL (ref 32.0–36.0)
MCV: 86.3 fL (ref 80.0–100.0)
MPV: 9.5 fL (ref 7.5–12.5)
Monocytes Relative: 9.5 %
Neutro Abs: 3334 {cells}/uL (ref 1500–7800)
Neutrophils Relative %: 62.9 %
Platelets: 294 10*3/uL (ref 140–400)
RBC: 5.34 10*6/uL — ABNORMAL HIGH (ref 3.80–5.10)
RDW: 13 % (ref 11.0–15.0)
Total Lymphocyte: 25.9 %
WBC: 5.3 10*3/uL (ref 3.8–10.8)

## 2024-02-03 LAB — C3 AND C4
C3 Complement: 184 mg/dL (ref 83–193)
C4 Complement: 29 mg/dL (ref 15–57)

## 2024-02-03 LAB — COMPREHENSIVE METABOLIC PANEL WITH GFR
AG Ratio: 1.5 (calc) (ref 1.0–2.5)
ALT: 20 U/L (ref 6–29)
AST: 30 U/L (ref 10–30)
Albumin: 4.7 g/dL (ref 3.6–5.1)
Alkaline phosphatase (APISO): 37 U/L (ref 31–125)
BUN: 8 mg/dL (ref 7–25)
CO2: 23 mmol/L (ref 20–32)
Calcium: 9.9 mg/dL (ref 8.6–10.2)
Chloride: 104 mmol/L (ref 98–110)
Creat: 0.75 mg/dL (ref 0.50–0.96)
Globulin: 3.2 g/dL (ref 1.9–3.7)
Glucose, Bld: 95 mg/dL (ref 65–99)
Potassium: 4 mmol/L (ref 3.5–5.3)
Sodium: 137 mmol/L (ref 135–146)
Total Bilirubin: 0.5 mg/dL (ref 0.2–1.2)
Total Protein: 7.9 g/dL (ref 6.1–8.1)
eGFR: 111 mL/min/{1.73_m2} (ref >=60)

## 2024-02-03 LAB — SEDIMENTATION RATE: Sed Rate: 14 mm/h (ref 0–20)

## 2024-02-03 LAB — PROTEIN / CREATININE RATIO, URINE
Creatinine, Urine: 47 mg/dL (ref 20–275)
Protein/Creat Ratio: 106 mg/g{creat} (ref 24–184)
Protein/Creatinine Ratio: 0.106 mg/mg{creat} (ref 0.024–0.184)
Total Protein, Urine: 5 mg/dL (ref 5–24)

## 2024-02-03 LAB — ANTI-DNA ANTIBODY, DOUBLE-STRANDED: ds DNA Ab: 30 [IU]/mL — ABNORMAL HIGH

## 2024-02-06 ENCOUNTER — Ambulatory Visit: Payer: Self-pay | Admitting: Physician Assistant

## 2024-02-06 NOTE — Progress Notes (Signed)
 CBC stable.   CMP WNL Urine protein creatinine ratio WNL  ESR WNL dsDNA remains positive but continues to trend down.  No change in treatment recommended at this time.  Complements WNL.

## 2024-04-27 ENCOUNTER — Other Ambulatory Visit: Payer: Self-pay | Admitting: Physician Assistant

## 2024-04-27 DIAGNOSIS — R768 Other specified abnormal immunological findings in serum: Secondary | ICD-10-CM

## 2024-04-29 NOTE — Telephone Encounter (Signed)
 Last Fill: 02/02/2024  Eye exam: 08/07/2023 WNL   Labs: 02/02/2024 CBC stable.   CMP WNL  Urine protein creatinine ratio WNL  ESR WNL  dsDNA remains positive but continues to trend down.  No change in treatment recommended at this time.  Complements WNL.     Next Visit: 06/24/2024  Last Visit: 02/02/2024  IK:Jlunpfflwz disease   Current Dose per office note 02/02/2024: Plaquenil  200 mg 1 tablet by mouth twice daily   Okay to refill Plaquenil ?

## 2024-06-10 NOTE — Progress Notes (Unsigned)
 Office Visit Note  Patient: Ariana Scott             Date of Birth: Apr 07, 1996           MRN: [id number]             PCP: Pura Lenis, MD Referring: Pura Lenis, MD Visit Date: 06/24/2024 Occupation: Data Unavailable  Subjective:  Medication monitoring  History of Present Illness: Ariana Scott is a 28 y.o. female with history of autoimmune disease.  Patient remains on plaquenil  200 mg 1 tablet by mouth twice daily.  She is tolerating Plaquenil  without any side effects and has not had any gaps in therapy.  Her symptoms of rosacea have been well-controlled with the use of metronidazole gel.  She has been wearing SPF 100 on a daily basis.  Patient continues to notice photosensitivity despite wearing a hat while outdoors and sunscreen daily. Patient has a new job and has been walking more than usual.  Patient has lost weight due to the increase in activity.  Her energy level has been stable.  Patient is concerned that she may have sleep apnea but does not yet have a sleep study.  She was noted some increased discomfort in her SI joints especially around her menstrual cycle.  The symptoms initially subsided while taking Plaquenil  but seem to be more active recently.  She denies any joint swelling at this time.  Activities of Daily Living:  Patient reports morning stiffness for 30 minutes.   Patient Reports nocturnal pain.  Difficulty dressing/grooming: Denies Difficulty climbing stairs: Denies Difficulty getting out of chair: Denies Difficulty using hands for taps, buttons, cutlery, and/or writing: Denies  Review of Systems  Constitutional:  Negative for fatigue.  HENT:  Negative for mouth sores and mouth dryness.   Eyes:  Negative for dryness.  Respiratory:  Negative for shortness of breath.   Cardiovascular:  Negative for chest pain and palpitations.  Gastrointestinal:  Negative for blood in stool, constipation and diarrhea.  Endocrine: Negative for increased urination.   Genitourinary:  Negative for involuntary urination.  Musculoskeletal:  Positive for joint pain, joint pain, joint swelling, myalgias, muscle weakness, morning stiffness, muscle tenderness and myalgias. Negative for gait problem.  Skin:  Positive for rash and sensitivity to sunlight. Negative for color change and hair loss.  Allergic/Immunologic: Negative for susceptible to infections.  Neurological:  Negative for dizziness and headaches.  Hematological:  Negative for swollen glands.  Psychiatric/Behavioral:  Positive for sleep disturbance. Negative for depressed mood. The patient is not nervous/anxious.     PMFS History:  Patient Active Problem List   Diagnosis Date Noted   Abnormal MRI 11/18/2021   Vision changes 11/18/2021   Chronic migraine w/o aura w/o status migrainosus, not intractable 11/18/2021   Hypothyroidism due to Hashimoto's thyroiditis 03/24/2020   Hair loss 08/13/2019   Chronic midline low back pain without sciatica 07/08/2019   Elevated LFTs 07/08/2019   Fatty liver 07/08/2019   Autoimmune disease 07/08/2019   Anxiety 05/01/2019   Lichen planus 05/01/2019   Other acne 05/01/2019   Hypothyroidism (acquired) 01/24/2019   Dry skin 08/14/2018   Erosive lichen planus of vulva 09/13/2015   Diarrhea 09/12/2014   Cervical sprain 09/11/2013    Past Medical History:  Diagnosis Date   Autoimmune disease    Eczema    Erosive lichen planus of vulva    Hashimoto's disease    History of rosacea    Hypothyroidism    IBS (irritable bowel syndrome)  Migraines    Polycystic ovarian syndrome     Family History  Problem Relation Age of Onset   Diabetes Mother    Depression Mother    ADD / ADHD Mother    Depression Father    Vitamin D  deficiency Father    Colitis Father    Hypertension Father    High Cholesterol Father    ADD / ADHD Brother    Past Surgical History:  Procedure Laterality Date   COLONOSCOPY  2020   Dr. Kristie   eyelid surgery Right 03/26/2021    upper eyelid surgery   SKIN BIOPSY  06/2023   on face, [facility]   UPPER GASTROINTESTINAL ENDOSCOPY  2020   Dr. Kristie   WISDOM TOOTH EXTRACTION  2016   Social History   Tobacco Use   Smoking status: Never    Passive exposure: Past   Smokeless tobacco: Never  Vaping Use   Vaping status: Never Used  Substance Use Topics   Alcohol use: Yes    Comment: occ- once monthly    Drug use: Not Currently   Social History   Social History Narrative   Not on file     Immunization History  Administered Date(s) Administered   DTaP 10/16/1995, 11/29/1995, 01/11/1996, 01/10/1997, 04/28/2000   HIB (PRP-OMP) 10/16/1995, 11/29/1995, 01/11/1996, 01/10/1997   HPV Quadrivalent 05/21/2007, 07/03/2008   Hepatitis A 05/21/2007, 07/03/2008   Hepatitis B 03/22/95, 08/01/1995, 01/11/1996   IPV 10/16/1995, 11/29/1995, 01/11/1996, 04/28/2000   MMR 07/02/1996, 04/28/2000   Meningococcal Conjugate 05/21/2007   PFIZER(Purple Top)SARS-COV-2 Vaccination 12/07/2019, 12/31/2019, 08/18/2020   Pfizer Covid-19 Vaccine Bivalent Booster 41yrs & up 06/08/2021   Pfizer(Comirnaty)Fall Seasonal Vaccine 12 years and older 08/17/2022, 06/10/2023   Tdap 04/23/2007     Objective: Vital Signs: BP 129/87   Pulse 68   Temp 97.6 F (36.4 C)   Resp 16   Ht 5' 5.5 (1.664 m)   Wt 229 lb 12.8 oz (104.2 kg)   LMP 06/10/2024 (Approximate)   BMI 37.66 kg/m    Physical Exam Vitals and nursing note reviewed.  Constitutional:      Appearance: She is well-developed.  HENT:     Head: Normocephalic and atraumatic.  Eyes:     Conjunctiva/sclera: Conjunctivae normal.  Cardiovascular:     Rate and Rhythm: Normal rate and regular rhythm.     Heart sounds: Normal heart sounds.  Pulmonary:     Effort: Pulmonary effort is normal.     Breath sounds: Normal breath sounds.  Abdominal:     General: Bowel sounds are normal.     Palpations: Abdomen is soft.  Musculoskeletal:     Cervical back: Normal  range of motion.  Lymphadenopathy:     Cervical: No cervical adenopathy.  Skin:    General: Skin is warm and dry.     Capillary Refill: Capillary refill takes less than 2 seconds.  Neurological:     Mental Status: She is alert and oriented to person, place, and time.  Psychiatric:        Behavior: Behavior normal.      Musculoskeletal Exam: C-spine, thoracic spine, lumbar spine have good range of motion.  No midline spinal tenderness.  Mild SI joint tenderness bilaterally.  Shoulder joints, elbow joints, wrist joints, MCPs, PIPs, DIPs have good range of motion with no synovitis.  Complete fist formation bilaterally.  Hip joints have good range of motion with no groin pain.  Knee joints have good range of motion no warmth  or effusion.  Ankle joints have good range of motion no tenderness or joint swelling.   CDAI Exam: CDAI Score: -- Patient Global: --; Provider Global: -- Swollen: --; Tender: -- Joint Exam 06/24/2024   No joint exam has been documented for this visit   There is currently no information documented on the homunculus. Go to the Rheumatology activity and complete the homunculus joint exam.  Investigation: No additional findings.  Imaging: No results found.  Recent Labs: Lab Results  Component Value Date   WBC 5.3 02/02/2024   HGB 15.3 02/02/2024   PLT 294 02/02/2024   NA 137 02/02/2024   K 4.0 02/02/2024   CL 104 02/02/2024   CO2 23 02/02/2024   GLUCOSE 95 02/02/2024   BUN 8 02/02/2024   CREATININE 0.75 02/02/2024   BILITOT 0.5 02/02/2024   AST 30 02/02/2024   ALT 20 02/02/2024   PROT 7.9 02/02/2024   CALCIUM 9.9 02/02/2024   GFRAA 133 03/01/2021   QFTBGOLDPLUS NEGATIVE 05/05/2023    Speciality Comments: PLQ eye exam: 08/07/2023 W[facility] Follow up in 1 year.                              Procedures:  No procedures performed Allergies: Clindamycin, Doxycycline, Cefuroxime, Kiwi extract, and Pineapple     Assessment / Plan:     Visit  Diagnoses:Autoimmune disease - Positive ANA, double-stranded DNA, positive Ro, history of photosensitivity, facial rash and elevated LFTs: Overall she is clinically doing well taking Plaquenil  200 mg 1 tablet by mouth twice daily.  She is tolerating Plaquenil  without any side effects and has not had any gaps in therapy.  No synovitis noted on examination today.  She continues to have photosensitivity and has been wearing sunscreen on a daily basis as well as a hat while outdoors.  No new rashes.  Her rosacea has been better controlled while using Metronidazole gel daily.  No pustules noted.   Fingertips cool to the touch.  No signs of sclerodactyly noted.  No digital ulcerations noted. No oral or nasal ulcerations.  No sicca symptoms.  Her energy level has been stable. Lab work from 02/02/2024 was reviewed today in the office: Double-stranded DNA 30-continues to trend down, urine protein creatinine ratio within normal limits, ESR within normal limits, complements within normal limits.  No signs of a flare.  Plan to update the following lab work today for further evaluation.  She will remain on Plaquenil  as prescribed.  She was advised to notify us  if she develops signs or symptoms of a flare.  She will follow-up in the office in 5 months or sooner if needed.- Plan: CBC with Differential/Platelet, Comprehensive metabolic panel with GFR, Protein / creatinine ratio, urine, C3 and C4, Sedimentation rate, Anti-DNA antibody, double-stranded, ANA   High risk medication use - Plaquenil  200 mg 1 tablet by mouth twice daily started in September 2020.  CBC and CMP updated on 02/02/24.  Orders for CBC and CMP released today.   PLQ eye exam: 08/07/2023 [facility] Follow up in 1 year.  She was given a new Plaquenil  examination form to take with her to her next appointment.  - Plan: CBC with Differential/Platelet, Comprehensive metabolic panel with GFR  Positive ANA (antinuclear antibody) -Plan to update the  following lab work today.  She remain on Plaquenil  as prescribed.  Plan: CBC with Differential/Platelet, Comprehensive metabolic panel with GFR, Protein / creatinine  ratio, urine, C3 and C4, Sedimentation rate, Anti-DNA antibody, double-stranded, ANA  Rosacea - Confirmed by biopsy on 07/07/23. Significantly improved since initiating metronidazole gel twice daily. Faint diffuse facial erythema noted but improved.  No pustules noted.  Fatty liver - Right upper quadrant abdominal ultrasound performed on 04/02/2019.  LFTs within normal limits on 11/14/2022. LFTs WNL on 05/05/23.  Plan to update CMP today.  Chronic SI joint pain: Patient has tenderness of both SI joints on examination today.  Her symptoms are typically exacerbated by her menstrual cycle.  She takes Extra strength Tylenol as needed.  Other medical conditions are listed as follows:   Eczema of both hands  Other fatigue  Anxiety and depression  Lichen planus  History of hypothyroidism  Vitamin D  deficiency  Hx of migraines - Migraines with sun exposure.  She wears a hat while outdoors as well as SPF 100 daily.  Orders: Orders Placed This Encounter  Procedures   CBC with Differential/Platelet   Comprehensive metabolic panel with GFR   Protein / creatinine ratio, urine   C3 and C4   Sedimentation rate   Anti-DNA antibody, double-stranded   ANA   No orders of the defined types were placed in this encounter.    Follow-Up Instructions: Return in about 5 months (around 11/22/2024) for Autoimmune Disease.   Waddell CHRISTELLA Craze, PA-C  Note - This record has been created using Dragon software.  Chart creation errors have been sought, but may not always  have been located. Such creation errors do not reflect on  the standard of medical care.

## 2024-06-24 ENCOUNTER — Encounter: Payer: Self-pay | Admitting: Physician Assistant

## 2024-06-24 ENCOUNTER — Ambulatory Visit: Attending: Physician Assistant | Admitting: Physician Assistant

## 2024-06-24 VITALS — BP 129/87 | HR 68 | Temp 97.6°F | Resp 16 | Ht 65.5 in | Wt 229.8 lb

## 2024-06-24 DIAGNOSIS — K760 Fatty (change of) liver, not elsewhere classified: Secondary | ICD-10-CM

## 2024-06-24 DIAGNOSIS — R768 Other specified abnormal immunological findings in serum: Secondary | ICD-10-CM

## 2024-06-24 DIAGNOSIS — L719 Rosacea, unspecified: Secondary | ICD-10-CM

## 2024-06-24 DIAGNOSIS — R7689 Other specified abnormal immunological findings in serum: Secondary | ICD-10-CM | POA: Diagnosis not present

## 2024-06-24 DIAGNOSIS — Z8669 Personal history of other diseases of the nervous system and sense organs: Secondary | ICD-10-CM

## 2024-06-24 DIAGNOSIS — Z79899 Other long term (current) drug therapy: Secondary | ICD-10-CM

## 2024-06-24 DIAGNOSIS — R5383 Other fatigue: Secondary | ICD-10-CM

## 2024-06-24 DIAGNOSIS — Z8639 Personal history of other endocrine, nutritional and metabolic disease: Secondary | ICD-10-CM

## 2024-06-24 DIAGNOSIS — M359 Systemic involvement of connective tissue, unspecified: Secondary | ICD-10-CM | POA: Diagnosis not present

## 2024-06-24 DIAGNOSIS — L309 Dermatitis, unspecified: Secondary | ICD-10-CM

## 2024-06-24 DIAGNOSIS — M533 Sacrococcygeal disorders, not elsewhere classified: Secondary | ICD-10-CM

## 2024-06-24 DIAGNOSIS — E559 Vitamin D deficiency, unspecified: Secondary | ICD-10-CM

## 2024-06-24 DIAGNOSIS — L439 Lichen planus, unspecified: Secondary | ICD-10-CM

## 2024-06-24 DIAGNOSIS — F32A Depression, unspecified: Secondary | ICD-10-CM

## 2024-06-24 DIAGNOSIS — G8929 Other chronic pain: Secondary | ICD-10-CM

## 2024-06-24 DIAGNOSIS — F419 Anxiety disorder, unspecified: Secondary | ICD-10-CM

## 2024-06-25 ENCOUNTER — Ambulatory Visit: Payer: Self-pay | Admitting: Physician Assistant

## 2024-06-25 NOTE — Progress Notes (Signed)
 CBC stable.  CMP WNL ESR WNL Complements WNL

## 2024-06-26 LAB — COMPREHENSIVE METABOLIC PANEL WITH GFR
AG Ratio: 1.5 (calc) (ref 1.0–2.5)
ALT: 18 U/L (ref 6–29)
AST: 24 U/L (ref 10–30)
Albumin: 4.6 g/dL (ref 3.6–5.1)
Alkaline phosphatase (APISO): 39 U/L (ref 31–125)
BUN: 10 mg/dL (ref 7–25)
CO2: 24 mmol/L (ref 20–32)
Calcium: 9.6 mg/dL (ref 8.6–10.2)
Chloride: 103 mmol/L (ref 98–110)
Creat: 0.7 mg/dL (ref 0.50–0.96)
Globulin: 3.1 g/dL (ref 1.9–3.7)
Glucose, Bld: 79 mg/dL (ref 65–139)
Potassium: 4.2 mmol/L (ref 3.5–5.3)
Sodium: 136 mmol/L (ref 135–146)
Total Bilirubin: 0.4 mg/dL (ref 0.2–1.2)
Total Protein: 7.7 g/dL (ref 6.1–8.1)
eGFR: 121 mL/min/1.73m2 (ref >=60)

## 2024-06-26 LAB — CBC WITH DIFFERENTIAL/PLATELET
Absolute Lymphocytes: 2002 {cells}/uL (ref 850–3900)
Absolute Monocytes: 369 {cells}/uL (ref 200–950)
Basophils Absolute: 31 {cells}/uL (ref 0–200)
Basophils Relative: 0.6 %
Eosinophils Absolute: 78 {cells}/uL (ref 15–500)
Eosinophils Relative: 1.5 %
HCT: 45.9 % — ABNORMAL HIGH (ref 35.0–45.0)
Hemoglobin: 15.3 g/dL (ref 11.7–15.5)
MCH: 29.1 pg (ref 27.0–33.0)
MCHC: 33.3 g/dL (ref 32.0–36.0)
MCV: 87.3 fL (ref 80.0–100.0)
MPV: 9.8 fL (ref 7.5–12.5)
Monocytes Relative: 7.1 %
Neutro Abs: 2720 {cells}/uL (ref 1500–7800)
Neutrophils Relative %: 52.3 %
Platelets: 273 Thousand/uL (ref 140–400)
RBC: 5.26 Million/uL — ABNORMAL HIGH (ref 3.80–5.10)
RDW: 12.9 % (ref 11.0–15.0)
Total Lymphocyte: 38.5 %
WBC: 5.2 Thousand/uL (ref 3.8–10.8)

## 2024-06-26 LAB — PROTEIN / CREATININE RATIO, URINE
Creatinine, Urine: 32 mg/dL (ref 20–275)
Total Protein, Urine: <4 mg/dL — ABNORMAL LOW (ref 5–24)

## 2024-06-26 LAB — ANTI-NUCLEAR AB-TITER (ANA TITER): ANA Titer 1: 1:80 {titer} — ABNORMAL HIGH

## 2024-06-26 LAB — SEDIMENTATION RATE: Sed Rate: 14 mm/h (ref 0–20)

## 2024-06-26 LAB — ANTI-DNA ANTIBODY, DOUBLE-STRANDED: ds DNA Ab: 20 [IU]/mL — ABNORMAL HIGH

## 2024-06-26 LAB — ANA: Anti Nuclear Antibody (ANA): POSITIVE — AB

## 2024-06-26 LAB — C3 AND C4
C3 Complement: 168 mg/dL (ref 83–193)
C4 Complement: 27 mg/dL (ref 15–57)

## 2024-06-26 NOTE — Progress Notes (Signed)
 No proteinuria  dsDNA remains positive but continues to trend down/improve.  Great news! No change in treatment at this time.

## 2024-06-27 NOTE — Progress Notes (Signed)
 ANA remains postiive.

## 2024-06-28 ENCOUNTER — Ambulatory Visit: Admitting: Physician Assistant

## 2024-07-05 ENCOUNTER — Ambulatory Visit: Admitting: Physician Assistant

## 2024-07-23 ENCOUNTER — Encounter: Payer: Self-pay | Admitting: Rheumatology

## 2024-07-23 ENCOUNTER — Telehealth: Payer: Self-pay

## 2024-07-23 NOTE — Telephone Encounter (Signed)
 Patient called the office stating she received a letter in the mail from [facility] client Services advising that her labs would not be covered, she contacted her insurance and they told her that a PA would be needed from the authorizing provider in order to have the labs covered. Advised patient that a PA is not something we do for labs, but a diagnosis code may be what they are looking for but was not sure without seeing the letter that was received. Patient is going to send a picture of the letter through My Chart.

## 2024-07-24 NOTE — Telephone Encounter (Signed)
 Contacted the patient and advised her to contact her insurance and have them fax us  something that states specifically what they need from our office or have them give her their fax number and we can fax over her last office visit note. Patient verbalized understanding.

## 2024-07-24 NOTE — Telephone Encounter (Signed)
 Patient called back and states the provider is required to call [phone] to obtain pre-authorization for the labs (post authorization in this particular case).   I asked patient if this is a new plan as the EOB states she has not met the deductible. Patient confirmed this is a new plan.   I was advised by the insurance company that the plan is active as of 06/12/2024. Based on the labs that were drawn, they require authorization. In this case, a post service claim review must be initiated. They are unable to fax information for completion.   Claims dept phone #[phone].  I was transferred to the Claims dept and have been advise that the rendering service provider details do not match their records. I have provided Waddell, Dr. Jammie, our office information and that [facility] drew the labs. The representative could not release any information about the claim to me since I could not confirm rendering service provider details. I was advised that the patient can call and initiate a post service claims review.  Call reference number [id number].  I verified with [facility] phlebotomist that [facility] is not affiliated with them.    I attempted to contact the patient and left a message to advise the patient she needs to contact the insurance company to initiate a post service claims review and she can access her records on mychart to submit or we can fax.

## 2024-07-26 NOTE — Telephone Encounter (Signed)
 Contacted the patient and patient states she is working with [employer] and her insurance to do a post approval and reopen the claim. Patient states she will contact our office again if she needs anything from us .

## 2024-07-27 ENCOUNTER — Other Ambulatory Visit: Payer: Self-pay | Admitting: Physician Assistant

## 2024-07-27 DIAGNOSIS — R7689 Other specified abnormal immunological findings in serum: Secondary | ICD-10-CM

## 2024-07-29 NOTE — Telephone Encounter (Signed)
 Last Fill: 04/29/2024  Eye exam: 08/07/2023   Labs: 06/24/2024 CBC stable.  CMP WNL ESR WNL Complements WNL No proteinuria  dsDNA remains positive but continues to trend down/improve.  Great news! No change in treatment at this time.  ANA remains postiive.   Next Visit: 11/29/2024  Last Visit: 06/24/2024  IK:Jlunpfflwz disease   Current Dose per office note on 06/24/2024: Plaquenil  200 mg 1 tablet by mouth twice daily   Okay to refill Plaquenil ?

## 2024-11-22 ENCOUNTER — Ambulatory Visit: Admitting: Internal Medicine

## 2024-11-26 ENCOUNTER — Ambulatory Visit: Admitting: Internal Medicine

## 2024-11-29 ENCOUNTER — Ambulatory Visit: Admitting: Rheumatology
